# Patient Record
Sex: Male | Born: 1950
Health system: Southern US, Community
[De-identification: ages and names within clinical notes are randomized; demographics above are authoritative.]

## PROBLEM LIST (undated history)

## (undated) DIAGNOSIS — N210 Calculus in bladder: Secondary | ICD-10-CM

## (undated) DIAGNOSIS — I8390 Asymptomatic varicose veins of unspecified lower extremity: Secondary | ICD-10-CM

## (undated) DIAGNOSIS — N400 Enlarged prostate without lower urinary tract symptoms: Secondary | ICD-10-CM

## (undated) DIAGNOSIS — Q446 Cystic disease of liver: Secondary | ICD-10-CM

## (undated) DIAGNOSIS — E785 Hyperlipidemia, unspecified: Secondary | ICD-10-CM

## (undated) DIAGNOSIS — R002 Palpitations: Secondary | ICD-10-CM

## (undated) DIAGNOSIS — I4819 Other persistent atrial fibrillation: Secondary | ICD-10-CM

## (undated) DIAGNOSIS — I878 Other specified disorders of veins: Secondary | ICD-10-CM

## (undated) DIAGNOSIS — Z9289 Personal history of other medical treatment: Secondary | ICD-10-CM

## (undated) DIAGNOSIS — M51369 Other intervertebral disc degeneration, lumbar region without mention of lumbar back pain or lower extremity pain: Secondary | ICD-10-CM

## (undated) DIAGNOSIS — I4891 Unspecified atrial fibrillation: Secondary | ICD-10-CM

## (undated) DIAGNOSIS — I10 Essential (primary) hypertension: Secondary | ICD-10-CM

## (undated) DIAGNOSIS — M5136 Other intervertebral disc degeneration, lumbar region: Secondary | ICD-10-CM

## (undated) HISTORY — DX: Unspecified atrial fibrillation: I48.91

## (undated) HISTORY — PX: LASER ABLATION: SHX1947

## (undated) HISTORY — DX: Other persistent atrial fibrillation: I48.19

## (undated) HISTORY — DX: Essential (primary) hypertension: I10

## (undated) HISTORY — PX: FINGER FRACTURE SURGERY: SHX638

## (undated) HISTORY — PX: PROSTATE SURGERY: SHX751

## (undated) HISTORY — PX: TONSILLECTOMY: SUR1361

## (undated) HISTORY — PX: HERNIA REPAIR: SHX51

## (undated) HISTORY — PX: COLON SURGERY: SHX602

## (undated) HISTORY — DX: Hyperlipidemia, unspecified: E78.5

## (undated) SURGERY — COLONOSCOPY WITH PROPOFOL
Anesthesia: General

---

## 2008-07-18 ENCOUNTER — Ambulatory Visit: Payer: Self-pay | Admitting: Orthopedic Surgery

## 2008-07-18 ENCOUNTER — Other Ambulatory Visit: Payer: Self-pay

## 2008-07-22 ENCOUNTER — Ambulatory Visit: Payer: Self-pay | Admitting: Orthopedic Surgery

## 2008-09-23 ENCOUNTER — Encounter: Payer: Self-pay | Admitting: Orthopedic Surgery

## 2008-10-12 ENCOUNTER — Encounter: Payer: Self-pay | Admitting: Orthopedic Surgery

## 2013-08-27 ENCOUNTER — Ambulatory Visit: Payer: Self-pay | Admitting: Orthopedic Surgery

## 2015-06-03 ENCOUNTER — Other Ambulatory Visit: Payer: Self-pay | Admitting: Cardiology

## 2015-06-03 DIAGNOSIS — R1011 Right upper quadrant pain: Secondary | ICD-10-CM

## 2015-06-09 ENCOUNTER — Other Ambulatory Visit: Payer: Self-pay | Admitting: Internal Medicine

## 2015-06-10 ENCOUNTER — Ambulatory Visit (HOSPITAL_BASED_OUTPATIENT_CLINIC_OR_DEPARTMENT_OTHER): Payer: Self-pay

## 2015-06-10 ENCOUNTER — Ambulatory Visit
Admission: RE | Admit: 2015-06-10 | Discharge: 2015-06-10 | Disposition: A | Discharge status: Home / Self Care | Payer: 59 | Source: Ambulatory Visit | Attending: Cardiology | Admitting: Cardiology

## 2015-06-10 DIAGNOSIS — R1011 Right upper quadrant pain: Secondary | ICD-10-CM

## 2015-06-10 DIAGNOSIS — K7689 Other specified diseases of liver: Secondary | ICD-10-CM | POA: Diagnosis not present

## 2016-04-13 DIAGNOSIS — H60509 Unspecified acute noninfective otitis externa, unspecified ear: Secondary | ICD-10-CM | POA: Diagnosis not present

## 2016-04-20 DIAGNOSIS — H6042 Cholesteatoma of left external ear: Secondary | ICD-10-CM | POA: Diagnosis not present

## 2016-04-20 DIAGNOSIS — H9192 Unspecified hearing loss, left ear: Secondary | ICD-10-CM | POA: Diagnosis not present

## 2016-04-20 DIAGNOSIS — M5440 Lumbago with sciatica, unspecified side: Secondary | ICD-10-CM | POA: Diagnosis not present

## 2016-04-20 DIAGNOSIS — H60509 Unspecified acute noninfective otitis externa, unspecified ear: Secondary | ICD-10-CM | POA: Diagnosis not present

## 2016-05-05 DIAGNOSIS — H60332 Swimmer's ear, left ear: Secondary | ICD-10-CM | POA: Diagnosis not present

## 2016-05-05 DIAGNOSIS — H6122 Impacted cerumen, left ear: Secondary | ICD-10-CM | POA: Diagnosis not present

## 2016-05-19 DIAGNOSIS — H60333 Swimmer's ear, bilateral: Secondary | ICD-10-CM | POA: Diagnosis not present

## 2016-05-23 DIAGNOSIS — Z125 Encounter for screening for malignant neoplasm of prostate: Secondary | ICD-10-CM | POA: Diagnosis not present

## 2016-05-23 DIAGNOSIS — R5381 Other malaise: Secondary | ICD-10-CM | POA: Diagnosis not present

## 2016-05-23 DIAGNOSIS — E784 Other hyperlipidemia: Secondary | ICD-10-CM | POA: Diagnosis not present

## 2016-05-23 DIAGNOSIS — I10 Essential (primary) hypertension: Secondary | ICD-10-CM | POA: Diagnosis not present

## 2016-05-30 DIAGNOSIS — Z0000 Encounter for general adult medical examination without abnormal findings: Secondary | ICD-10-CM | POA: Diagnosis not present

## 2016-09-12 DIAGNOSIS — Z23 Encounter for immunization: Secondary | ICD-10-CM | POA: Diagnosis not present

## 2016-09-12 DIAGNOSIS — C801 Malignant (primary) neoplasm, unspecified: Secondary | ICD-10-CM | POA: Diagnosis not present

## 2016-09-12 DIAGNOSIS — H60509 Unspecified acute noninfective otitis externa, unspecified ear: Secondary | ICD-10-CM | POA: Diagnosis not present

## 2016-09-12 DIAGNOSIS — D5701 Hb-SS disease with acute chest syndrome: Secondary | ICD-10-CM | POA: Diagnosis not present

## 2016-09-12 DIAGNOSIS — H9212 Otorrhea, left ear: Secondary | ICD-10-CM | POA: Diagnosis not present

## 2017-06-27 ENCOUNTER — Ambulatory Visit
Admission: RE | Admit: 2017-06-27 | Discharge: 2017-06-27 | Disposition: A | Discharge status: Home / Self Care | Payer: PPO | Source: Ambulatory Visit | Attending: Internal Medicine | Admitting: Internal Medicine

## 2017-06-27 ENCOUNTER — Other Ambulatory Visit: Payer: Self-pay | Admitting: Internal Medicine

## 2017-06-27 DIAGNOSIS — M25512 Pain in left shoulder: Secondary | ICD-10-CM | POA: Diagnosis not present

## 2017-06-27 DIAGNOSIS — M19012 Primary osteoarthritis, left shoulder: Secondary | ICD-10-CM | POA: Diagnosis not present

## 2017-06-27 DIAGNOSIS — M47812 Spondylosis without myelopathy or radiculopathy, cervical region: Secondary | ICD-10-CM | POA: Insufficient documentation

## 2017-07-24 DIAGNOSIS — M19012 Primary osteoarthritis, left shoulder: Secondary | ICD-10-CM | POA: Diagnosis not present

## 2017-07-24 DIAGNOSIS — M75102 Unspecified rotator cuff tear or rupture of left shoulder, not specified as traumatic: Secondary | ICD-10-CM | POA: Diagnosis not present

## 2017-07-24 DIAGNOSIS — M47812 Spondylosis without myelopathy or radiculopathy, cervical region: Secondary | ICD-10-CM | POA: Diagnosis not present

## 2017-07-24 DIAGNOSIS — M4692 Unspecified inflammatory spondylopathy, cervical region: Secondary | ICD-10-CM | POA: Diagnosis not present

## 2017-08-03 DIAGNOSIS — E784 Other hyperlipidemia: Secondary | ICD-10-CM | POA: Diagnosis not present

## 2017-08-03 DIAGNOSIS — R5381 Other malaise: Secondary | ICD-10-CM | POA: Diagnosis not present

## 2017-08-03 DIAGNOSIS — I10 Essential (primary) hypertension: Secondary | ICD-10-CM | POA: Diagnosis not present

## 2017-08-03 DIAGNOSIS — Z125 Encounter for screening for malignant neoplasm of prostate: Secondary | ICD-10-CM | POA: Diagnosis not present

## 2017-09-06 ENCOUNTER — Ambulatory Visit: Payer: Self-pay

## 2017-09-06 DIAGNOSIS — H2513 Age-related nuclear cataract, bilateral: Secondary | ICD-10-CM | POA: Diagnosis not present

## 2017-09-07 ENCOUNTER — Ambulatory Visit: Payer: Self-pay

## 2017-09-29 ENCOUNTER — Encounter (INDEPENDENT_AMBULATORY_CARE_PROVIDER_SITE_OTHER): Payer: Self-pay | Admitting: Vascular Surgery

## 2017-09-29 ENCOUNTER — Ambulatory Visit (INDEPENDENT_AMBULATORY_CARE_PROVIDER_SITE_OTHER): Payer: PPO | Admitting: Vascular Surgery

## 2017-09-29 VITALS — BP 127/79 | HR 67 | Resp 16 | Ht 75.0 in | Wt 258.0 lb

## 2017-09-29 DIAGNOSIS — I10 Essential (primary) hypertension: Secondary | ICD-10-CM

## 2017-09-29 DIAGNOSIS — I83813 Varicose veins of bilateral lower extremities with pain: Secondary | ICD-10-CM

## 2017-09-29 DIAGNOSIS — R600 Localized edema: Secondary | ICD-10-CM | POA: Diagnosis not present

## 2017-09-29 NOTE — Progress Notes (Signed)
Subjective:    Patient ID: Luis Patton, male    DOB: [birth year], 66 y.o.   MRN: [id number] Chief Complaint  Patient presents with  . New Patient (Initial Visit)    Varicose Veins   Presents as a new patient with a chief complaint of "painful varicose veins". Patient endorses a long-standing history of varicose veins with discomfort to his lower extremities. Recently, the patient has noticed a discoloration forming on his calves. This is what prompted him to seek medical attention. The discomfort to the patient's varicose veins worsens towards the end of the day. The patient also experiences "mild" bilateral lower extremity swelling. The swelling is associated with a tingling sensation to his feet. Patient denies any surgery or trauma to the lower extremity. Patient denies any DVT history. At this time, the patient is not engaging in conservative therapy.Patient denies any claudication-like symptoms rest pain or ulcerations lower extremity. The patient's discomfort has progressed to the point he is unable to function on a daily basis. Patient denies any fever, nausea or vomiting.   Review of Systems  Constitutional: Negative.   HENT: Negative.   Eyes: Negative.   Respiratory: Negative.   Cardiovascular: Positive for leg swelling.       Painful varicose veins  Gastrointestinal: Negative.   Endocrine: Negative.   Genitourinary: Negative.   Musculoskeletal: Negative.   Skin: Positive for color change.  Allergic/Immunologic: Negative.   Neurological: Negative.   Hematological: Negative.   Psychiatric/Behavioral: Negative.       Objective:   Physical Exam  Constitutional: He is oriented to person, place, and time. He appears well-developed and well-nourished. No distress.  HENT:  Head: Normocephalic and atraumatic.  Eyes: Pupils are equal, round, and reactive to light. Conjunctivae are normal.  Neck: Normal range of motion.  Cardiovascular: Normal rate, regular rhythm, normal heart  sounds and intact distal pulses.   Pulses:      Radial pulses are 2+ on the right side, and 2+ on the left side.       Dorsalis pedis pulses are 2+ on the right side, and 2+ on the left side.       Posterior tibial pulses are 2+ on the right side, and 2+ on the left side.  Pulmonary/Chest: Effort normal.  Musculoskeletal: Normal range of motion. He exhibits edema (mild bilateral lower extremity edema).  Neurological: He is alert and oriented to person, place, and time.  Skin: He is not diaphoretic.  >1cm varicose veins scattered to the bilateral lower extremity. There is mild stasis dermatitis noted bilaterally. There are no skin changes. There is no cellulitis.  Psychiatric: He has a normal mood and affect. His behavior is normal. Judgment and thought content normal.  Vitals reviewed.  BP 127/79 (BP Location: Right Arm)   Pulse 67   Resp 16   Ht 6\' 3"  (1.905 m)   Wt 258 lb (117 kg)   BMI 32.25 kg/m   Past Medical History:  Diagnosis Date  . Hyperlipidemia   . Hypertension    Social History   Social History  . Marital status: Married    Spouse name: N/A  . Number of children: N/A  . Years of education: N/A   Occupational History  . Not on file.   Social History Main Topics  . Smoking status: Former Research scientist (life sciences)  . Smokeless tobacco: Never Used  . Alcohol use No  . Drug use: Unknown  . Sexual activity: Not on file   Other Topics Concern  .  Not on file   Social History Narrative  . No narrative on file   No past surgical history on file.  No family history on file.  No Known Allergies     Assessment & Plan:  Presents as a new patient with a chief complaint of "painful varicose veins". Patient endorses a long-standing history of varicose veins with discomfort to his lower extremities. Recently, the patient has noticed a discoloration forming on his calves. This is what prompted him to seek medical attention. The discomfort to the patient's varicose veins worsens  towards the end of the day. The patient also experiences "mild" bilateral lower extremity swelling. The swelling is associated with a tingling sensation to his feet. Patient denies any surgery or trauma to the lower extremity. Patient denies any DVT history. At this time, the patient is not engaging in conservative therapy.Patient denies any claudication-like symptoms rest pain or ulcerations lower extremity. The patient's discomfort has progressed to the point he is unable to function on a daily basis. Patient denies any fever, nausea or vomiting.  1. Varicose veins of both lower extremities with pain - New manage them.  The patient was encouraged to wear graduated compression stockings (20-30 mmHg) on a daily basis. The patient was instructed to begin wearing the stockings first thing in the morning and removing them in the evening. The patient was instructed specifically not to sleep in the stockings. Prescription given. In addition, behavioral modification including elevation during the day will be initiated. Anti-inflammatories for pain. The patient will follow up in three months to asses conservative management.  Information on chronic venous insufficiency and compression stockings was given to the patient. The patient was instructed to call the office in the interim if any worsening edema or ulcerations to the legs, feet or toes occurs. The patient expresses their understanding.  - VAS [place] LOWER EXTREMITY VENOUS REFLUX; Future  2. Bilateral lower extremity edema - New As above  3. Essential hypertension - Stable As above  No current outpatient prescriptions on file prior to visit.   No current facility-administered medications on file prior to visit.    There are no Patient Instructions on file for this visit. No Follow-up on file.  Ady Heimann A Taje Tondreau, PA-C

## 2017-10-05 ENCOUNTER — Ambulatory Visit (INDEPENDENT_AMBULATORY_CARE_PROVIDER_SITE_OTHER): Payer: PPO | Admitting: Urology

## 2017-10-05 ENCOUNTER — Encounter: Payer: Self-pay | Admitting: Urology

## 2017-10-05 VITALS — BP 124/76 | HR 82 | Ht 75.0 in | Wt 254.6 lb

## 2017-10-05 DIAGNOSIS — R310 Gross hematuria: Secondary | ICD-10-CM | POA: Diagnosis not present

## 2017-10-05 DIAGNOSIS — R3129 Other microscopic hematuria: Secondary | ICD-10-CM

## 2017-10-05 LAB — BLADDER SCAN AMB NON-IMAGING: SCAN RESULT: 117

## 2017-10-05 MED ORDER — ALFUZOSIN HCL ER 10 MG PO TB24: 10.0000 mg | ORAL_TABLET | Freq: Every day | ORAL | 11 refills | Status: DC

## 2017-10-05 NOTE — Progress Notes (Signed)
10/05/2017 3:35 PM   Luis Patton [birth year] [id number]  Referring provider: Cletis Athens, MD [address] [place], [place] [address]  Chief Complaint  Patient presents with  . Hematuria    HPI: The patient is a 66 year old gentleman presents today for evaluation for gross hematuria.  1.  Gross hematuria The patient presents after developing gross hematuria.  He had approximately 3 episodes of gross painless hematuria.  He has persistent microscopic hematuria today.  No history of nephrolithiasis.  He was treated earlier this year for a UTI.  No current dysuria.  Microscopic hematuria presents today.  2. BPH Patient with complaints of nocturia x3-4, weak stream, hesitancy, intermittency, and feeling of incomplete bladder emptying.  His PVR today is 117.  He has been on Flomax for this in the past which completely resolve his symptoms.  He had minor dizziness so his PCP told him to stop this.  He was on finasteride then for 1 month which he did not notice symptom improvement but did not like the way it made him feel.  3.  Prostate cancer screening PSA was 1.6 in August 2018.   PMH: Past Medical History:  Diagnosis Date  . Hyperlipidemia   . Hypertension     Surgical History: Past Surgical History:  Procedure Laterality Date  . HERNIA REPAIR      Home Medications:  Allergies as of 10/05/2017   No Known Allergies     Medication List       Accurate as of 10/05/17  3:35 PM. Always use your most recent med list.          alfuzosin 10 MG 24 hr tablet Commonly known as:  UROXATRAL Take 1 tablet (10 mg total) by mouth daily with breakfast.   amLODipine 5 MG tablet Commonly known as:  NORVASC Take by mouth.   aspirin EC 81 MG tablet Take 81 mg by mouth daily.   benazepril 40 MG tablet Commonly known as:  LOTENSIN Take by mouth.   gabapentin 300 MG capsule Commonly known as:  NEURONTIN Take by mouth.   hydrochlorothiazide 12.5 MG capsule Commonly known  as:  MICROZIDE Take by mouth.   multivitamin tablet Take 1 tablet by mouth daily.   triamcinolone 55 MCG/ACT Aero nasal inhaler Commonly known as:  NASACORT Place into the nose.       Allergies: No Known Allergies  Family History: Family History  Problem Relation Age of Onset  . Prostate cancer Neg Hx   . Bladder Cancer Neg Hx   . Kidney cancer Neg Hx     Social History:  reports that he has quit smoking. He has never used smokeless tobacco. He reports that he does not drink alcohol. His drug history is not on file.  ROS: UROLOGY Frequent Urination?: Yes Hard to postpone urination?: Yes Burning/pain with urination?: Yes Get up at night to urinate?: Yes Leakage of urine?: Yes Urine stream starts and stops?: Yes Trouble starting stream?: Yes Do you have to strain to urinate?: Yes Blood in urine?: Yes Urinary tract infection?: Yes Sexually transmitted disease?: No Injury to kidneys or bladder?: No Painful intercourse?: No Weak stream?: Yes Erection problems?: Yes Penile pain?: No  Gastrointestinal Nausea?: No Vomiting?: No Indigestion/heartburn?: Yes Diarrhea?: No Constipation?: No  Constitutional Fever: No Night sweats?: No Weight loss?: No Fatigue?: Yes  Skin Skin rash/lesions?: No Itching?: No  Eyes Blurred vision?: Yes Double vision?: No  Ears/Nose/Throat Sore throat?: No Sinus problems?: Yes  Hematologic/Lymphatic Swollen glands?: No  Easy bruising?: Yes  Cardiovascular Leg swelling?: Yes Chest pain?: No  Respiratory Cough?: No Shortness of breath?: No  Endocrine Excessive thirst?: No  Musculoskeletal Back pain?: Yes Joint pain?: Yes  Neurological Headaches?: Yes Dizziness?: Yes  Psychologic Depression?: No Anxiety?: No  Physical Exam: BP 124/76 (BP Location: Right Arm, Patient Position: Sitting, Cuff Size: Normal)   Pulse 82   Ht 6\' 3"  (1.905 m)   Wt 254 lb 9.6 oz (115.5 kg)   BMI 31.82 kg/m   Constitutional:   Alert and oriented, No acute distress. HEENT: [place] AT, moist mucus membranes.  Trachea midline, no masses. Cardiovascular: No clubbing, cyanosis, or edema. Respiratory: Normal respiratory effort, no increased work of breathing. GI: Abdomen is soft, nontender, nondistended, no abdominal masses GU: No CVA tenderness.  Normal phallus.  Testicles descended bilaterally.  Benign.  DRE 30 g benign Skin: No rashes, bruises or suspicious lesions. Lymph: No cervical or inguinal adenopathy. Neurologic: Grossly intact, no focal deficits, moving all 4 extremities. Psychiatric: Normal mood and affect.  Laboratory Data: No results found for: WBC, HGB, HCT, MCV, PLT  No results found for: CREATININE  No results found for: PSA  No results found for: TESTOSTERONE  No results found for: HGBA1C  Urinalysis No results found for: COLORURINE, APPEARANCEUR, LABSPEC, PHURINE, GLUCOSEU, HGBUR, BILIRUBINUR, KETONESUR, PROTEINUR, UROBILINOGEN, NITRITE, LEUKOCYTESUR  Assessment & Plan:    1.  Gross hematuria We will arrange for CT hematuria protocol followed by office cystoscopy  2.  BPH Will start the patient on alfuzosin.  Patient warned to stop this medication if he experiences dizziness.  3.  Prostate cancer screening Up-to-date   Return for after CT for cysto.  Nickie Retort, MD  [facility] [address] [place], [place] [address] [phone]

## 2017-10-05 NOTE — H&P (View-Only) (Signed)
10/05/2017 3:35 PM   Luis Patton [birth year] [id number]  Referring provider: Cletis Athens, MD [address] [place], [place] [address]  Chief Complaint  Patient presents with  . Hematuria    HPI: The patient is a 66 year old gentleman presents today for evaluation for gross hematuria.  1.  Gross hematuria The patient presents after developing gross hematuria.  He had approximately 3 episodes of gross painless hematuria.  He has persistent microscopic hematuria today.  No history of nephrolithiasis.  He was treated earlier this year for a UTI.  No current dysuria.  Microscopic hematuria presents today.  2. BPH Patient with complaints of nocturia x3-4, weak stream, hesitancy, intermittency, and feeling of incomplete bladder emptying.  His PVR today is 117.  He has been on Flomax for this in the past which completely resolve his symptoms.  He had minor dizziness so his PCP told him to stop this.  He was on finasteride then for 1 month which he did not notice symptom improvement but did not like the way it made him feel.  3.  Prostate cancer screening PSA was 1.6 in August 2018.   PMH: Past Medical History:  Diagnosis Date  . Hyperlipidemia   . Hypertension     Surgical History: Past Surgical History:  Procedure Laterality Date  . HERNIA REPAIR      Home Medications:  Allergies as of 10/05/2017   No Known Allergies     Medication List       Accurate as of 10/05/17  3:35 PM. Always use your most recent med list.          alfuzosin 10 MG 24 hr tablet Commonly known as:  UROXATRAL Take 1 tablet (10 mg total) by mouth daily with breakfast.   amLODipine 5 MG tablet Commonly known as:  NORVASC Take by mouth.   aspirin EC 81 MG tablet Take 81 mg by mouth daily.   benazepril 40 MG tablet Commonly known as:  LOTENSIN Take by mouth.   gabapentin 300 MG capsule Commonly known as:  NEURONTIN Take by mouth.   hydrochlorothiazide 12.5 MG capsule Commonly known  as:  MICROZIDE Take by mouth.   multivitamin tablet Take 1 tablet by mouth daily.   triamcinolone 55 MCG/ACT Aero nasal inhaler Commonly known as:  NASACORT Place into the nose.       Allergies: No Known Allergies  Family History: Family History  Problem Relation Age of Onset  . Prostate cancer Neg Hx   . Bladder Cancer Neg Hx   . Kidney cancer Neg Hx     Social History:  reports that he has quit smoking. He has never used smokeless tobacco. He reports that he does not drink alcohol. His drug history is not on file.  ROS: UROLOGY Frequent Urination?: Yes Hard to postpone urination?: Yes Burning/pain with urination?: Yes Get up at night to urinate?: Yes Leakage of urine?: Yes Urine stream starts and stops?: Yes Trouble starting stream?: Yes Do you have to strain to urinate?: Yes Blood in urine?: Yes Urinary tract infection?: Yes Sexually transmitted disease?: No Injury to kidneys or bladder?: No Painful intercourse?: No Weak stream?: Yes Erection problems?: Yes Penile pain?: No  Gastrointestinal Nausea?: No Vomiting?: No Indigestion/heartburn?: Yes Diarrhea?: No Constipation?: No  Constitutional Fever: No Night sweats?: No Weight loss?: No Fatigue?: Yes  Skin Skin rash/lesions?: No Itching?: No  Eyes Blurred vision?: Yes Double vision?: No  Ears/Nose/Throat Sore throat?: No Sinus problems?: Yes  Hematologic/Lymphatic Swollen glands?: No  Easy bruising?: Yes  Cardiovascular Leg swelling?: Yes Chest pain?: No  Respiratory Cough?: No Shortness of breath?: No  Endocrine Excessive thirst?: No  Musculoskeletal Back pain?: Yes Joint pain?: Yes  Neurological Headaches?: Yes Dizziness?: Yes  Psychologic Depression?: No Anxiety?: No  Physical Exam: BP 124/76 (BP Location: Right Arm, Patient Position: Sitting, Cuff Size: Normal)   Pulse 82   Ht 6\' 3"  (1.905 m)   Wt 254 lb 9.6 oz (115.5 kg)   BMI 31.82 kg/m   Constitutional:   Alert and oriented, No acute distress. HEENT: [place] AT, moist mucus membranes.  Trachea midline, no masses. Cardiovascular: No clubbing, cyanosis, or edema. Respiratory: Normal respiratory effort, no increased work of breathing. GI: Abdomen is soft, nontender, nondistended, no abdominal masses GU: No CVA tenderness.  Normal phallus.  Testicles descended bilaterally.  Benign.  DRE 30 g benign Skin: No rashes, bruises or suspicious lesions. Lymph: No cervical or inguinal adenopathy. Neurologic: Grossly intact, no focal deficits, moving all 4 extremities. Psychiatric: Normal mood and affect.  Laboratory Data: No results found for: WBC, HGB, HCT, MCV, PLT  No results found for: CREATININE  No results found for: PSA  No results found for: TESTOSTERONE  No results found for: HGBA1C  Urinalysis No results found for: COLORURINE, APPEARANCEUR, LABSPEC, PHURINE, GLUCOSEU, HGBUR, BILIRUBINUR, KETONESUR, PROTEINUR, UROBILINOGEN, NITRITE, LEUKOCYTESUR  Assessment & Plan:    1.  Gross hematuria We will arrange for CT hematuria protocol followed by office cystoscopy  2.  BPH Will start the patient on alfuzosin.  Patient warned to stop this medication if he experiences dizziness.  3.  Prostate cancer screening Up-to-date   Return for after CT for cysto.  Nickie Retort, MD  [facility] [address] [place], [place] [address] [phone]

## 2017-10-06 LAB — MICROSCOPIC EXAMINATION
Bacteria, UA: NONE SEEN
Epithelial Cells (non renal): NONE SEEN /hpf (ref 0–10)

## 2017-10-06 LAB — URINALYSIS, COMPLETE
BILIRUBIN UA: NEGATIVE
GLUCOSE, UA: NEGATIVE
KETONES UA: NEGATIVE
Nitrite, UA: NEGATIVE
SPEC GRAV UA: 1.015 (ref 1.005–1.030)
UUROB: 0.2 mg/dL (ref 0.2–1.0)
pH, UA: 5.5 (ref 5.0–7.5)

## 2017-10-17 ENCOUNTER — Ambulatory Visit
Admission: RE | Admit: 2017-10-17 | Discharge: 2017-10-17 | Disposition: A | Discharge status: Home / Self Care | Payer: PPO | Source: Ambulatory Visit | Attending: Urology | Admitting: Urology

## 2017-10-17 DIAGNOSIS — I700 Atherosclerosis of aorta: Secondary | ICD-10-CM | POA: Diagnosis not present

## 2017-10-17 DIAGNOSIS — K5730 Diverticulosis of large intestine without perforation or abscess without bleeding: Secondary | ICD-10-CM | POA: Diagnosis not present

## 2017-10-17 DIAGNOSIS — K56699 Other intestinal obstruction unspecified as to partial versus complete obstruction: Secondary | ICD-10-CM | POA: Diagnosis not present

## 2017-10-17 DIAGNOSIS — N210 Calculus in bladder: Secondary | ICD-10-CM | POA: Insufficient documentation

## 2017-10-17 DIAGNOSIS — Q446 Cystic disease of liver: Secondary | ICD-10-CM | POA: Insufficient documentation

## 2017-10-17 DIAGNOSIS — K7689 Other specified diseases of liver: Secondary | ICD-10-CM | POA: Diagnosis not present

## 2017-10-17 DIAGNOSIS — K4090 Unilateral inguinal hernia, without obstruction or gangrene, not specified as recurrent: Secondary | ICD-10-CM | POA: Insufficient documentation

## 2017-10-17 DIAGNOSIS — M5136 Other intervertebral disc degeneration, lumbar region: Secondary | ICD-10-CM | POA: Diagnosis not present

## 2017-10-17 DIAGNOSIS — N323 Diverticulum of bladder: Secondary | ICD-10-CM | POA: Insufficient documentation

## 2017-10-17 DIAGNOSIS — M47896 Other spondylosis, lumbar region: Secondary | ICD-10-CM | POA: Insufficient documentation

## 2017-10-17 DIAGNOSIS — R310 Gross hematuria: Secondary | ICD-10-CM

## 2017-10-17 LAB — POCT I-STAT CREATININE: CREATININE: 1.3 mg/dL — AB (ref 0.61–1.24)

## 2017-10-17 MED ORDER — IOPAMIDOL (ISOVUE-300) INJECTION 61%
125.0000 mL | Freq: Once | INTRAVENOUS | Status: AC | PRN
  Administered 2017-10-17: 125 mL via INTRAVENOUS

## 2017-10-19 ENCOUNTER — Ambulatory Visit: Payer: PPO | Admitting: Urology

## 2017-10-19 ENCOUNTER — Encounter: Payer: Self-pay | Admitting: Urology

## 2017-10-19 DIAGNOSIS — R310 Gross hematuria: Secondary | ICD-10-CM | POA: Diagnosis not present

## 2017-10-19 DIAGNOSIS — N210 Calculus in bladder: Secondary | ICD-10-CM | POA: Diagnosis not present

## 2017-10-19 DIAGNOSIS — K639 Disease of intestine, unspecified: Secondary | ICD-10-CM

## 2017-10-19 LAB — MICROSCOPIC EXAMINATION: RBC, UA: >30 /hpf — ABNORMAL HIGH (ref 0–?)

## 2017-10-19 LAB — URINALYSIS, COMPLETE
Bilirubin, UA: NEGATIVE
Glucose, UA: NEGATIVE
Ketones, UA: NEGATIVE
Leukocytes, UA: NEGATIVE
NITRITE UA: NEGATIVE
PH UA: 5.5 (ref 5.0–7.5)
Specific Gravity, UA: 1.015 (ref 1.005–1.030)
Urobilinogen, Ur: 0.2 mg/dL (ref 0.2–1.0)

## 2017-10-19 MED ORDER — LIDOCAINE HCL 2 % EX GEL
1.0000 "application " | Freq: Once | CUTANEOUS | Status: AC
  Administered 2017-10-19: 1 via URETHRAL

## 2017-10-19 MED ORDER — CIPROFLOXACIN HCL 500 MG PO TABS
500.0000 mg | ORAL_TABLET | Freq: Once | ORAL | Status: AC
  Administered 2017-10-19: 500 mg via ORAL

## 2017-10-19 NOTE — Progress Notes (Signed)
10/19/17  CC: No chief complaint on file.   HPI:  The patient is a 66 year old gentleman presents today for evaluation for gross hematuria.  1.  Gross hematuria The patient presents after developing gross hematuria.  He had approximately 3 episodes of gross painless hematuria.  He has persistent microscopic hematuria today.  No history of nephrolithiasis.  He was treated earlier this year for a UTI.  No current dysuria.  Microscopic hematuria presents today.  CT urogram showed a two-point centimeter calculus in the urinary bladder.  There are several bladder diverticula and mild bladder wall thickening.  No other source of hematuria.  There was an incidental finding of a 2.6 cm in length narrowing of the rectosigmoid colon.  Further evaluation via endoscopy was recommended if patient has not had recent colonoscopy.  2. BPH Patient with complaints of nocturia x3-4, weak stream, hesitancy, intermittency, and feeling of incomplete bladder emptying.  His PVR today is 117.  He has been on Flomax for this in the past which completely resolve his symptoms.  He had minor dizziness so his PCP told him to stop this.  He was on finasteride then for 1 month which he did not notice symptom improvement but did not like the way it made him feel. He was started on alfuzosin at his last appointment.  3.  Prostate cancer screening PSA was 1.6 in August 2018.  DRE benign October 2018.    There were no vitals taken for this visit. NED. A&Ox3.   No respiratory distress   Abd soft, NT, ND Normal phallus with bilateral descended testicles  Cystoscopy Procedure Note  Patient identification was confirmed, informed consent was obtained, and patient was prepped using Betadine solution.  Lidocaine jelly was administered per urethral meatus.    Preoperative abx where received prior to procedure.     Pre-Procedure: - Inspection reveals a normal caliber ureteral meatus.  Procedure: The flexible  cystoscope was introduced without difficulty - No urethral strictures/lesions are present. - Enlarged prostate enlarged visually obstructive.  Approximately 5 cm in length. - Normal bladder neck - Bilateral ureteral orifices identified - Bladder mucosa  reveals no ulcers, tumors, or lesions - No bladder stones -Diverticula noted in posterior wall.  It is large in size with a narrow opening.  Inspection was unremarkable within the diverticulum.  Retroflexion shows no intravesical lobe.   Post-Procedure: - Patient tolerated the procedure well  Assessment/ Plan:  1. Bladder stone I discussed with the patient that he has a bladder stone.  We discussed cystolitholopaxy to remove the stone.  We also discussed that the standard of care is an man with a bladder stone is to perform a concurrent TURP due to enlarged prostate being the source of the bladder stone.  I think this to also help his urinary symptoms which have not been well controlled with his medication.  We discussed the risks, benefits, indications of this procedure.  He understands the risks include but are not limited to bleeding, infection, need for hospitalization, need for Foley catheter, incontinence, iatrogenic injury, and incomplete symptom control.  He does understand that he will be admitted to the hospital on CBI for at least 1 night.  All questions were answered.  The patient will follow up for cystolitholapaxy with concurrent TURP.  2.  Gross hematuria We will arrange for CT hematuria protocol followed by office cystoscopy  3.  BPH Continue alfuzosin for now. TURP as above  4.  Prostate cancer screening Up-to-date  5.  Rectosigmoid narrowing -Refer to GI

## 2017-10-21 LAB — URINE CULTURE: ORGANISM ID, BACTERIA: NO GROWTH

## 2017-10-23 ENCOUNTER — Other Ambulatory Visit: Payer: Self-pay | Admitting: Radiology

## 2017-10-23 DIAGNOSIS — N210 Calculus in bladder: Secondary | ICD-10-CM

## 2017-10-23 DIAGNOSIS — N401 Enlarged prostate with lower urinary tract symptoms: Secondary | ICD-10-CM

## 2017-10-27 ENCOUNTER — Other Ambulatory Visit: Payer: Self-pay

## 2017-10-27 ENCOUNTER — Encounter
Admission: RE | Admit: 2017-10-27 | Discharge: 2017-10-27 | Disposition: A | Discharge status: Home / Self Care | Payer: PPO | Source: Ambulatory Visit | Attending: Urology | Admitting: Urology

## 2017-10-27 DIAGNOSIS — M5136 Other intervertebral disc degeneration, lumbar region: Secondary | ICD-10-CM | POA: Insufficient documentation

## 2017-10-27 DIAGNOSIS — E785 Hyperlipidemia, unspecified: Secondary | ICD-10-CM | POA: Diagnosis not present

## 2017-10-27 DIAGNOSIS — Z01810 Encounter for preprocedural cardiovascular examination: Secondary | ICD-10-CM | POA: Insufficient documentation

## 2017-10-27 DIAGNOSIS — Z01812 Encounter for preprocedural laboratory examination: Secondary | ICD-10-CM | POA: Insufficient documentation

## 2017-10-27 DIAGNOSIS — I10 Essential (primary) hypertension: Secondary | ICD-10-CM | POA: Diagnosis not present

## 2017-10-27 DIAGNOSIS — M7989 Other specified soft tissue disorders: Secondary | ICD-10-CM | POA: Insufficient documentation

## 2017-10-27 DIAGNOSIS — I83813 Varicose veins of bilateral lower extremities with pain: Secondary | ICD-10-CM | POA: Diagnosis not present

## 2017-10-27 HISTORY — DX: Other intervertebral disc degeneration, lumbar region: M51.36

## 2017-10-27 HISTORY — DX: Other intervertebral disc degeneration, lumbar region without mention of lumbar back pain or lower extremity pain: M51.369

## 2017-10-27 LAB — POTASSIUM: Potassium: 3.6 mmol/L (ref 3.5–5.1)

## 2017-10-27 NOTE — Patient Instructions (Addendum)
Your procedure is scheduled on: 11/07/17 Tues Report to Same Day Surgery 2nd floor medical mall [place] Entrance-take elevator on left to 2nd floor.  Check in with surgery information desk.) To find out your arrival time please call [phone] between 1PM - 3PM on 11/06/17 Mon  Remember: Instructions that are not followed completely may result in serious medical risk, up to and including death, or upon the discretion of your surgeon and anesthesiologist your surgery may need to be rescheduled.    _x___ 1. Do not eat food after midnight the night before your procedure. You may drink clear liquids up to 2 hours before you are scheduled to arrive at the hospital for your procedure.  Do not drink clear liquids within 2 hours of your scheduled arrival to the hospital.  Clear liquids include  --Water or Apple juice without pulp  --Clear carbohydrate beverage such as ClearFast or Gatorade  --Black Coffee or Clear Tea (No milk, no creamers, do not add anything to                  the coffee or Tea Type 1 and type 2 diabetics should only drink water.  No gum chewing or hard candies.     __x__ 2. No Alcohol for 24 hours before or after surgery.   __x__3. No Smoking for 24 prior to surgery.   ____  4. Bring all medications with you on the day of surgery if instructed.    __x__ 5. Notify your doctor if there is any change in your medical condition     (cold, fever, infections).     Do not wear jewelry, make-up, hairpins, clips or nail polish.  Do not wear lotions, powders, or perfumes. You may wear deodorant.  Do not shave 48 hours prior to surgery. Men may shave face and neck.  Do not bring valuables to the hospital.    [facility] is not responsible for any belongings or valuables.               Contacts, dentures or bridgework may not be worn into surgery.  Leave your suitcase in the car. After surgery it may be brought to your room.  For patients admitted to the hospital,  discharge time is determined by your                       treatment team.   Patients discharged the day of surgery will not be allowed to drive home.  You will need someone to drive you home and stay with you the night of your procedure.    Please read over the following fact sheets that you were given:   [facility] Preparing for Surgery and or MRSA Information   _x___ Take anti-hypertensive listed below, cardiac, seizure, asthma,     anti-reflux and psychiatric medicines. These include:  1. amLODipine (NORVASC) 5 MG   2.Prilosec the night before and the morning of surgery  3.  4.  5.  6.  ____Fleets enema or Magnesium Citrate as directed.   _x___ Use CHG Soap or sage wipes as directed on instruction sheet   ____ Use inhalers on the day of surgery and bring to hospital day of surgery  ____ Stop Metformin and Janumet 2 days prior to surgery.    ____ Take 1/2 of usual insulin dose the night before surgery and none on the morning     surgery.   _x___ Follow recommendations from Cardiologist,  Pulmonologist or PCP regarding          stopping Aspirin, Coumadin, Plavix ,Eliquis, Effient, or Pradaxa, and Pletal.  X____Stop Anti-inflammatories such as Advil, Aleve, Ibuprofen, Motrin, Naproxen, Naprosyn, Goodies powders or aspirin products. OK to take Tylenol and                          Celebrex.   _x___ Stop supplements until after surgery.  But may continue Vitamin D, Vitamin B,       and multivitamin.   ____ Bring C-Pap to the hospital.

## 2017-10-30 MED ORDER — CEFAZOLIN SODIUM-DEXTROSE 2-4 GM/100ML-% IV SOLN
2.0000 g | INTRAVENOUS | Status: AC
  Administered 2017-10-31: 2 g via INTRAVENOUS

## 2017-10-31 ENCOUNTER — Other Ambulatory Visit: Payer: Self-pay

## 2017-10-31 ENCOUNTER — Ambulatory Visit: Payer: PPO | Admitting: Anesthesiology

## 2017-10-31 ENCOUNTER — Observation Stay
Admission: RE | Admit: 2017-10-31 | Discharge: 2017-11-01 | Disposition: A | Discharge status: Home / Self Care | Payer: PPO | Source: Ambulatory Visit | Attending: Urology | Admitting: Urology

## 2017-10-31 ENCOUNTER — Encounter: Payer: Self-pay | Admitting: *Deleted

## 2017-10-31 ENCOUNTER — Encounter
Admission: RE | Disposition: A | Discharge status: Home / Self Care | Payer: Self-pay | Source: Ambulatory Visit | Attending: Urology

## 2017-10-31 DIAGNOSIS — N411 Chronic prostatitis: Secondary | ICD-10-CM | POA: Diagnosis not present

## 2017-10-31 DIAGNOSIS — N401 Enlarged prostate with lower urinary tract symptoms: Principal | ICD-10-CM | POA: Insufficient documentation

## 2017-10-31 DIAGNOSIS — E669 Obesity, unspecified: Secondary | ICD-10-CM | POA: Diagnosis not present

## 2017-10-31 DIAGNOSIS — Z7982 Long term (current) use of aspirin: Secondary | ICD-10-CM | POA: Diagnosis not present

## 2017-10-31 DIAGNOSIS — I10 Essential (primary) hypertension: Secondary | ICD-10-CM | POA: Insufficient documentation

## 2017-10-31 DIAGNOSIS — N3289 Other specified disorders of bladder: Secondary | ICD-10-CM | POA: Insufficient documentation

## 2017-10-31 DIAGNOSIS — N320 Bladder-neck obstruction: Secondary | ICD-10-CM | POA: Insufficient documentation

## 2017-10-31 DIAGNOSIS — N138 Other obstructive and reflux uropathy: Secondary | ICD-10-CM | POA: Insufficient documentation

## 2017-10-31 DIAGNOSIS — Z6833 Body mass index (BMI) 33.0-33.9, adult: Secondary | ICD-10-CM | POA: Diagnosis not present

## 2017-10-31 DIAGNOSIS — N323 Diverticulum of bladder: Secondary | ICD-10-CM | POA: Insufficient documentation

## 2017-10-31 DIAGNOSIS — E785 Hyperlipidemia, unspecified: Secondary | ICD-10-CM | POA: Insufficient documentation

## 2017-10-31 DIAGNOSIS — N400 Enlarged prostate without lower urinary tract symptoms: Secondary | ICD-10-CM | POA: Diagnosis not present

## 2017-10-31 DIAGNOSIS — Z87891 Personal history of nicotine dependence: Secondary | ICD-10-CM | POA: Diagnosis not present

## 2017-10-31 DIAGNOSIS — M1990 Unspecified osteoarthritis, unspecified site: Secondary | ICD-10-CM | POA: Diagnosis not present

## 2017-10-31 DIAGNOSIS — Z9079 Acquired absence of other genital organ(s): Secondary | ICD-10-CM

## 2017-10-31 DIAGNOSIS — M5136 Other intervertebral disc degeneration, lumbar region: Secondary | ICD-10-CM | POA: Insufficient documentation

## 2017-10-31 DIAGNOSIS — N210 Calculus in bladder: Secondary | ICD-10-CM

## 2017-10-31 DIAGNOSIS — Z79899 Other long term (current) drug therapy: Secondary | ICD-10-CM | POA: Insufficient documentation

## 2017-10-31 HISTORY — PX: TRANSURETHRAL RESECTION OF PROSTATE: SHX73

## 2017-10-31 HISTORY — PX: CYSTOSCOPY WITH LITHOLAPAXY: SHX1425

## 2017-10-31 SURGERY — TURP (TRANSURETHRAL RESECTION OF PROSTATE)
Anesthesia: General | Site: Prostate | Wound class: Clean Contaminated

## 2017-10-31 MED ORDER — OXYCODONE HCL 5 MG/5ML PO SOLN: 5.0000 mg | Freq: Once | ORAL | Status: DC | PRN

## 2017-10-31 MED ORDER — EPHEDRINE SULFATE 50 MG/ML IJ SOLN
INTRAMUSCULAR | Status: AC
  Filled 2017-10-31: qty 1

## 2017-10-31 MED ORDER — ROCURONIUM BROMIDE 50 MG/5ML IV SOLN
INTRAVENOUS | Status: AC
  Filled 2017-10-31: qty 1

## 2017-10-31 MED ORDER — FENTANYL CITRATE (PF) 100 MCG/2ML IJ SOLN
INTRAMUSCULAR | Status: AC
  Filled 2017-10-31: qty 2

## 2017-10-31 MED ORDER — PROPOFOL 10 MG/ML IV BOLUS
INTRAVENOUS | Status: DC | PRN
  Administered 2017-10-31: 40 mg via INTRAVENOUS
  Administered 2017-10-31: 190 mg via INTRAVENOUS
  Administered 2017-10-31: 50 mg via INTRAVENOUS

## 2017-10-31 MED ORDER — DEXAMETHASONE SODIUM PHOSPHATE 10 MG/ML IJ SOLN
INTRAMUSCULAR | Status: AC
  Filled 2017-10-31: qty 1

## 2017-10-31 MED ORDER — FENTANYL CITRATE (PF) 100 MCG/2ML IJ SOLN: 25.0000 ug | INTRAMUSCULAR | Status: DC | PRN

## 2017-10-31 MED ORDER — HYDROMORPHONE HCL 1 MG/ML IJ SOLN
INTRAMUSCULAR | Status: DC | PRN
  Administered 2017-10-31: .2 mg via INTRAVENOUS
  Administered 2017-10-31: .4 mg via INTRAVENOUS
  Administered 2017-10-31 (×2): .2 mg via INTRAVENOUS

## 2017-10-31 MED ORDER — FLUTICASONE PROPIONATE 50 MCG/ACT NA SUSP
1.0000 | Freq: Every day | NASAL | Status: DC | PRN
  Filled 2017-10-31: qty 16

## 2017-10-31 MED ORDER — MEPERIDINE HCL 50 MG/ML IJ SOLN: 6.2500 mg | INTRAMUSCULAR | Status: DC | PRN

## 2017-10-31 MED ORDER — PANTOPRAZOLE SODIUM 40 MG PO TBEC
40.0000 mg | DELAYED_RELEASE_TABLET | Freq: Every day | ORAL | Status: DC
  Administered 2017-11-01: 40 mg via ORAL
  Filled 2017-10-31: qty 1

## 2017-10-31 MED ORDER — ACETAMINOPHEN 10 MG/ML IV SOLN
INTRAVENOUS | Status: AC
  Filled 2017-10-31: qty 100

## 2017-10-31 MED ORDER — LIDOCAINE HCL (PF) 2 % IJ SOLN
INTRAMUSCULAR | Status: AC
  Filled 2017-10-31: qty 10

## 2017-10-31 MED ORDER — LACTATED RINGERS IV SOLN
INTRAVENOUS | Status: DC
  Administered 2017-10-31: 75 mL/h via INTRAVENOUS
  Administered 2017-11-01: 02:00:00 via INTRAVENOUS

## 2017-10-31 MED ORDER — ONDANSETRON HCL 4 MG/2ML IJ SOLN
INTRAMUSCULAR | Status: DC | PRN
  Administered 2017-10-31: 4 mg via INTRAVENOUS

## 2017-10-31 MED ORDER — PROPOFOL 10 MG/ML IV BOLUS
INTRAVENOUS | Status: AC
  Filled 2017-10-31: qty 20

## 2017-10-31 MED ORDER — BELLADONNA ALKALOIDS-OPIUM 16.2-60 MG RE SUPP: 1.0000 | Freq: Three times a day (TID) | RECTAL | Status: DC | PRN

## 2017-10-31 MED ORDER — DEXAMETHASONE SODIUM PHOSPHATE 10 MG/ML IJ SOLN
INTRAMUSCULAR | Status: DC | PRN
  Administered 2017-10-31: 8 mg via INTRAVENOUS

## 2017-10-31 MED ORDER — PHENYLEPHRINE HCL 10 MG/ML IJ SOLN
INTRAMUSCULAR | Status: AC
  Filled 2017-10-31: qty 1

## 2017-10-31 MED ORDER — PHENYLEPHRINE HCL 10 MG/ML IJ SOLN
INTRAMUSCULAR | Status: DC | PRN
  Administered 2017-10-31 (×8): 100 ug via INTRAVENOUS

## 2017-10-31 MED ORDER — AMLODIPINE BESYLATE 5 MG PO TABS
5.0000 mg | ORAL_TABLET | Freq: Every day | ORAL | Status: DC
  Administered 2017-11-01: 5 mg via ORAL
  Filled 2017-10-31: qty 1

## 2017-10-31 MED ORDER — EPHEDRINE SULFATE 50 MG/ML IJ SOLN
INTRAMUSCULAR | Status: DC | PRN
  Administered 2017-10-31: 5 mg via INTRAVENOUS
  Administered 2017-10-31 (×2): 10 mg via INTRAVENOUS
  Administered 2017-10-31: 5 mg via INTRAVENOUS

## 2017-10-31 MED ORDER — OXYCODONE HCL 5 MG PO TABS: 5.0000 mg | ORAL_TABLET | Freq: Once | ORAL | Status: DC | PRN

## 2017-10-31 MED ORDER — OXYCODONE-ACETAMINOPHEN 5-325 MG PO TABS: 1.0000 | ORAL_TABLET | ORAL | Status: DC | PRN

## 2017-10-31 MED ORDER — SUCCINYLCHOLINE CHLORIDE 20 MG/ML IJ SOLN
INTRAMUSCULAR | Status: DC | PRN
  Administered 2017-10-31: 140 mg via INTRAVENOUS

## 2017-10-31 MED ORDER — ACETAMINOPHEN 10 MG/ML IV SOLN
INTRAVENOUS | Status: DC | PRN
Start: 2017-10-31 — End: 2017-10-31
  Administered 2017-10-31: 1000 mg via INTRAVENOUS

## 2017-10-31 MED ORDER — ONDANSETRON HCL 4 MG/2ML IJ SOLN: 4.0000 mg | Freq: Three times a day (TID) | INTRAMUSCULAR | Status: DC | PRN

## 2017-10-31 MED ORDER — ONDANSETRON HCL 4 MG/2ML IJ SOLN
INTRAMUSCULAR | Status: AC
  Filled 2017-10-31: qty 2

## 2017-10-31 MED ORDER — CEFAZOLIN SODIUM-DEXTROSE 2-4 GM/100ML-% IV SOLN
INTRAVENOUS | Status: AC
  Filled 2017-10-31: qty 100

## 2017-10-31 MED ORDER — FENTANYL CITRATE (PF) 100 MCG/2ML IJ SOLN
INTRAMUSCULAR | Status: DC | PRN
  Administered 2017-10-31 (×6): 50 ug via INTRAVENOUS

## 2017-10-31 MED ORDER — HYDROCHLOROTHIAZIDE 12.5 MG PO CAPS
12.5000 mg | ORAL_CAPSULE | Freq: Every day | ORAL | Status: DC
  Administered 2017-10-31 – 2017-11-01 (×2): 12.5 mg via ORAL
  Filled 2017-10-31 (×2): qty 1

## 2017-10-31 MED ORDER — LACTATED RINGERS IV SOLN
INTRAVENOUS | Status: DC
  Administered 2017-10-31 (×3): via INTRAVENOUS

## 2017-10-31 MED ORDER — ADULT MULTIVITAMIN W/MINERALS CH
1.0000 | ORAL_TABLET | Freq: Every day | ORAL | Status: DC
Start: 2017-11-01 — End: 2017-11-01
  Administered 2017-11-01: 1 via ORAL
  Filled 2017-10-31: qty 1

## 2017-10-31 MED ORDER — HYDROMORPHONE HCL 1 MG/ML IJ SOLN
INTRAMUSCULAR | Status: AC
Start: 2017-10-31 — End: ?
  Filled 2017-10-31: qty 1

## 2017-10-31 MED ORDER — BENAZEPRIL HCL 20 MG PO TABS
40.0000 mg | ORAL_TABLET | Freq: Every day | ORAL | Status: DC
  Administered 2017-10-31 – 2017-11-01 (×2): 40 mg via ORAL
  Filled 2017-10-31: qty 1
  Filled 2017-10-31: qty 2

## 2017-10-31 MED ORDER — PROMETHAZINE HCL 25 MG/ML IJ SOLN: 6.2500 mg | INTRAMUSCULAR | Status: DC | PRN

## 2017-10-31 SURGICAL SUPPLY — 25 items
ADAPTER IRRIG TUBE 2 SPIKE SOL (ADAPTER) ×6 IMPLANT
BAG DRAIN CYSTO-URO LG1000N (MISCELLANEOUS) ×3 IMPLANT
BAG URO DRAIN 4000ML (MISCELLANEOUS) ×3 IMPLANT
CATH FOL 2WAY LX 24X30 (CATHETERS) IMPLANT
CATH FOL LEG HOLDER (MISCELLANEOUS) ×3 IMPLANT
CATH FOLEY 3WAY 30CC 22FR (CATHETERS) ×3 IMPLANT
DRAPE UTILITY 15X26 TOWEL STRL (DRAPES) ×3 IMPLANT
ELECT LOOP 22F BIPOLAR SML (ELECTROSURGICAL)
ELECTRODE LOOP 22F BIPOLAR SML (ELECTROSURGICAL) IMPLANT
FIBER LASER 550 (Laser) ×3 IMPLANT
GLOVE BIOGEL M 8.0 STRL (GLOVE) ×3 IMPLANT
GOWN STANDARD XL  REUSABL (MISCELLANEOUS) ×3 IMPLANT
GOWN STRL REUS W/ TWL LRG LVL3 (GOWN DISPOSABLE) ×4 IMPLANT
GOWN STRL REUS W/TWL LRG LVL3 (GOWN DISPOSABLE) ×2
HOLDER FOLEY CATH W/STRAP (MISCELLANEOUS) IMPLANT
KIT RM TURNOVER CYSTO AR (KITS) ×3 IMPLANT
LOOP CUT BIPOLAR 24F LRG (ELECTROSURGICAL) ×3 IMPLANT
PACK CYSTO AR (MISCELLANEOUS) ×3 IMPLANT
SET IRRIG Y TYPE TUR BLADDER L (SET/KITS/TRAYS/PACK) ×3 IMPLANT
SET IRRIGATING DISP (SET/KITS/TRAYS/PACK) ×3 IMPLANT
SOL .9 NS 3000ML IRR  AL (IV SOLUTION) ×4
SOL .9 NS 3000ML IRR UROMATIC (IV SOLUTION) ×8 IMPLANT
SYR TOOMEY 50ML (SYRINGE) ×3 IMPLANT
SYRINGE IRR TOOMEY STRL 70CC (SYRINGE) ×3 IMPLANT
WATER STERILE IRR 1000ML POUR (IV SOLUTION) ×3 IMPLANT

## 2017-10-31 NOTE — Anesthesia Procedure Notes (Signed)
Procedure Name: Intubation Date/Time: 10/31/2017 10:36 AM Performed by: Lowry Bowl, CRNA Pre-anesthesia Checklist: Patient identified, Emergency Drugs available, Suction available and Patient being monitored Patient Re-evaluated:Patient Re-evaluated prior to induction Oxygen Delivery Method: Circle system utilized Preoxygenation: Pre-oxygenation with 100% oxygen Induction Type: IV induction, Cricoid Pressure applied and Rapid sequence Ventilation: Mask ventilation without difficulty Laryngoscope Size: 4 and Mac Grade View: Grade II Tube type: Oral Tube size: 7.5 mm Number of attempts: 1 Airway Equipment and Method: Stylet Placement Confirmation: ETT inserted through vocal cords under direct vision,  positive ETCO2 and breath sounds checked- equal and bilateral Secured at: 23 cm Tube secured with: Tape Dental Injury: Teeth and Oropharynx as per pre-operative assessment

## 2017-10-31 NOTE — Anesthesia Post-op Follow-up Note (Signed)
Anesthesia QCDR form completed.        

## 2017-10-31 NOTE — Anesthesia Postprocedure Evaluation (Signed)
Anesthesia Post Note  Patient: Luis Patton  Procedure(s) Performed: TRANSURETHRAL RESECTION OF THE PROSTATE (TURP) (N/A Prostate) CYSTOSCOPY WITH LITHOLAPAXY (N/A Bladder)  Patient location during evaluation: PACU Anesthesia Type: General Level of consciousness: awake Pain management: pain level controlled Vital Signs Assessment: post-procedure vital signs reviewed and stable Cardiovascular status: stable Anesthetic complications: no     Last Vitals:  Vitals:   10/31/17 1327 10/31/17 1341  BP: 125/72 131/81  Pulse: 85 84  Resp: 17 13  Temp:  (!) 36.3 C  SpO2: 97% 98%    Last Pain:  Vitals:   10/31/17 1257  TempSrc:   PainSc: Asleep                 VAN STAVEREN,Schuyler Olden

## 2017-10-31 NOTE — Transfer of Care (Signed)
Immediate Anesthesia Transfer of Care Note  Patient: Luis Patton  Procedure(s) Performed: TRANSURETHRAL RESECTION OF THE PROSTATE (TURP) (N/A Prostate) CYSTOSCOPY WITH LITHOLAPAXY (N/A Bladder)  Patient Location: PACU  Anesthesia Type:General  Level of Consciousness: awake, oriented and patient cooperative  Airway & Oxygen Therapy: Patient Spontanous Breathing and Patient connected to face mask oxygen  Post-op Assessment: Report given to RN, Post -op Vital signs reviewed and stable and Patient moving all extremities  Post vital signs: Reviewed and stable  Last Vitals:  Vitals:   10/31/17 0915 10/31/17 1257  BP: 124/85 (!) 115/58  Pulse: 75 94  Resp: 16 18  Temp: 36.4 C (!) 36.3 C  SpO2: 99% 96%    Last Pain:  Vitals:   10/31/17 0915  TempSrc: Tympanic         Complications: No apparent anesthesia complications

## 2017-10-31 NOTE — Interval H&P Note (Signed)
History and Physical Interval Note:  10/31/2017 10:10 AM  Luis Patton  has presented today for surgery, with the diagnosis of Bladder stone, BPH  The various methods of treatment have been discussed with the patient and family. After consideration of risks, benefits and other options for treatment, the patient has consented to  Procedure(s): TRANSURETHRAL RESECTION OF THE PROSTATE (TURP) (N/A) CYSTOSCOPY WITH LITHOLAPAXY (N/A) as a surgical intervention .  The patient's history has been reviewed, patient examined, no change in status, stable for surgery.  I have reviewed the patient's chart and labs.  Questions were answered to the patient's satisfaction.     [place]

## 2017-10-31 NOTE — Anesthesia Preprocedure Evaluation (Signed)
Anesthesia Evaluation  Patient identified by MRN, date of birth, ID band Patient awake    Reviewed: Allergy & Precautions, NPO status , Patient's Chart, lab work & pertinent test results  History of Anesthesia Complications Negative for: history of anesthetic complications  Airway Mallampati: III  TM Distance: >3 FB Neck ROM: Full    Dental  (+) Poor Dentition   Pulmonary neg sleep apnea, neg COPD, former smoker,    breath sounds clear to auscultation- rhonchi (-) wheezing      Cardiovascular hypertension, Pt. on medications (-) CAD, (-) Past MI and (-) Cardiac Stents  Rhythm:Regular Rate:Normal - Systolic murmurs and - Diastolic murmurs    Neuro/Psych negative neurological ROS  negative psych ROS   GI/Hepatic negative GI ROS, Neg liver ROS,   Endo/Other  negative endocrine ROSneg diabetes  Renal/GU negative Renal ROS     Musculoskeletal  (+) Arthritis ,   Abdominal (+) + obese,   Peds  Hematology negative hematology ROS (+)   Anesthesia Other Findings Past Medical History: No date: DDD (degenerative disc disease), lumbar     Comment:  L4-5 No date: Hyperlipidemia No date: Hypertension   Reproductive/Obstetrics                             Anesthesia Physical Anesthesia Plan  ASA: II  Anesthesia Plan: General   Post-op Pain Management:    Induction: Intravenous  PONV Risk Score and Plan: 1 and Dexamethasone and Ondansetron  Airway Management Planned: Oral ETT  Additional Equipment:   Intra-op Plan:   Post-operative Plan: Extubation in OR  Informed Consent: I have reviewed the patients History and Physical, chart, labs and discussed the procedure including the risks, benefits and alternatives for the proposed anesthesia with the patient or authorized representative who has indicated his/her understanding and acceptance.   Dental advisory given  Plan Discussed with: CRNA  and Anesthesiologist  Anesthesia Plan Comments:         Anesthesia Quick Evaluation

## 2017-10-31 NOTE — Op Note (Signed)
Preoperative diagnosis: 1. Bladder outlet obstruction secondary to BPH 2. Bladder calculus   Postoperative diagnosis:  1. Bladder outlet obstruction secondary to BPH 2. Bladder calculus  Procedure:  1. Cystoscopy 2. Transurethral resection of the prostate 3. Cystolitholapaxy >2.5 cm  Surgeon: Nicki Reaper C. Stoioff M.D.  Anesthesia: General  Complications: None  EBL: Minimal  Specimens: 1. Prostate chips   Indication: Luis Patton is a patient with bladder outlet obstruction secondary to benign prostatic hyperplasia.  He has moderate to severe lower urinary tract symptoms.  He has approximately 3 cm jack stone bladder calculus.  After reviewing the management options for treatment, he elected to proceed with the above surgical procedure(s). We have discussed the potential benefits and risks of the procedure, side effects of the proposed treatment, the likelihood of the patient achieving the goals of the procedure, and any potential problems that might occur during the procedure or recuperation. Informed consent has been obtained.  Description of procedure:  The patient was taken to the operating room and general anesthesia was induced.  The patient was placed in the dorsal lithotomy position, prepped and draped in the usual sterile fashion, and preoperative antibiotics were administered. A preoperative time-out was performed.   Cystourethroscopy was performed.  The patient's urethra would not accept a 26 French continuous-flow resectoscope sheath and the meatus required dilation with [employer] sounds from 20-28 [place].  The resectoscope sheath with visual obturator was then passed per urethra.  No additional strictures were noted.  Lateral lobe enlargement with moderate bladder neck elevation was noted.  A median lobe was not present.  The calculus was identified at the bladder neck.    The bladder was then systematically examined in its entirety. There was no evidence of any bladder  tumors, stones, or other mucosal pathology.  There was a large posterior wall bladder diverticulum which was free of lesions.  There was also a moderate-sized left bladder wall diverticulum and scattered cellules and bladder trabeculation.  The ureteral orifices were identified and marked so as to be avoided during the procedure.  The prostate adenoma was then resected utilizing loop cautery resection with the bipolar cutting loop.  The prostate adenoma from the bladder neck back to the verumontanum was resected beginning at the six o'clock position and then extended to include the right and left lobes of the prostate and anterior prostate. Care was taken not to resect distal to the verumontanum.  Hemostasis was then achieved with the cautery and the bladder was emptied and reinspected with no significant bleeding noted at the end of the procedure.  All chips were removed with irrigation.  The adapter was placed in the resectoscope and a 550 m holmium laser fiber was placed through the working channel.  The injection arms were then fragmented at a setting of 0.2 J / 10 Hz and was subsequently increased to 20 Hz.  The body of the stone was hard and the power was increased to 1 J / 20 Hz.  All fragments were removed with irrigation.  There were additional stone fragments and chips noted in the posterior wall bladder diverticulum which were removed with irrigation.  The prostatic fossa was examined and hemostasis was noted to be adequate.  Several small stone fragments that had settled in the prostatic urethra were removed with irrigation.  A 3 way catheter was then placed into the bladder and placed on continuous bladder irrigation.  The patient appeared to tolerate the procedure well and without complications.  After  anesthetic reversal he was transported to the PACU in satisfactory condition.

## 2017-11-01 DIAGNOSIS — N401 Enlarged prostate with lower urinary tract symptoms: Secondary | ICD-10-CM | POA: Diagnosis not present

## 2017-11-01 LAB — SURGICAL PATHOLOGY

## 2017-11-01 MED ORDER — OXYCODONE-ACETAMINOPHEN 5-325 MG PO TABS: 1.0000 | ORAL_TABLET | ORAL | 0 refills | Status: DC | PRN

## 2017-11-01 MED ORDER — SULFAMETHOXAZOLE-TRIMETHOPRIM 800-160 MG PO TABS: 1.0000 | ORAL_TABLET | Freq: Two times a day (BID) | ORAL | 0 refills | Status: DC

## 2017-11-01 NOTE — Final Progress Note (Signed)
Voiding well Urine rose'  Plan: D/C

## 2017-11-01 NOTE — Progress Notes (Signed)
Pt discharged per MD order. IV removed. Prescription given to pt. Discharge instructions reviewed with pt. Questions answered to satisfaction. Pt taken to car in wheelchair by volunteer.

## 2017-11-01 NOTE — Progress Notes (Signed)
POD #1  No complaints Afebrile, vss  Abdomen soft, CBI effluent blood-tinged on low flow  Impression: Doing well status post cystolitholapaxy/TURP Plan: Discontinue CBI, if no significant hematuria will remove catheter midmorning and discharge.

## 2017-11-01 NOTE — Progress Notes (Signed)
Patient with continuous bladder irrigation with clear pink output. Patient complains of occasional bladder spasms but refuses opium-belladone suppository. No other complaints at this time. Will continue to monitor patient.

## 2017-11-01 NOTE — Discharge Summary (Signed)
Date of admission: 10/31/2017  Date of discharge: 11/01/2017  Admission diagnosis: BPH, bladder calculus  Discharge diagnosis: BPH, bladder calculus  Secondary diagnoses:  Patient Active Problem List   Diagnosis Date Noted  . S/P TURP 10/31/2017  . Varicose veins of both lower extremities with pain 09/29/2017  . Bilateral lower extremity edema 09/29/2017  . Essential hypertension 09/29/2017    History and Physical: For full details, please see admission history and physical. Briefly, Luis Patton is a 67 y.o. year old patient with BPH and a bladder calculus admitted for TURP/cystolitholapaxy.   Hospital Course: Patient tolerated the procedure well.  He was then transferred to the floor after an uneventful PACU stay.  His hospital course was uncomplicated.  On POD#1 he had met discharge criteria: was eating a regular diet, was up and ambulating independently,  pain was well controlled, was voiding without a catheter, and was ready to for discharge.  Foley catheter was removed prior to discharge.   Laboratory values:  No results for input(s): WBC, HGB, HCT in the last 72 hours. No results for input(s): NA, K, CL, CO2, GLUCOSE, BUN, CREATININE, CALCIUM in the last 72 hours. No results for input(s): LABPT, INR in the last 72 hours. No results for input(s): LABURIN in the last 72 hours. Results for orders placed or performed in visit on 10/19/17  Microscopic Examination     Status: Abnormal   Collection Time: 10/19/17  8:19 AM  Result Value Ref Range Status   WBC, UA 0-5 0 - 5 /hpf Final   RBC, UA >30 (H) 0 - 2 /hpf Final   Epithelial Cells (non renal) 0-10 0 - 10 /hpf Final   Casts Present (A) None seen /lpf Final   Cast Type Hyaline casts N/A Final   Mucus, UA Present (A) Not Estab. Final   Bacteria, UA Few (A) None seen/Few Final  Urine culture     Status: None   Collection Time: 10/19/17  9:15 AM  Result Value Ref Range Status   Urine Culture, Routine Final report  Final   Organism ID, Bacteria No growth  Final    Disposition: Home  Discharge instruction: The patient was instructed to be ambulatory but told to refrain from heavy lifting, strenuous activity, or driving.   Discharge medications:  Allergies as of 11/01/2017   No Known Allergies     Medication List    STOP taking these medications   acetaminophen 500 MG tablet Commonly known as:  TYLENOL   alfuzosin 10 MG 24 hr tablet Commonly known as:  UROXATRAL   aspirin EC 81 MG tablet   aspirin-acetaminophen-caffeine 250-250-65 MG tablet Commonly known as:  EXCEDRIN MIGRAINE   CRANBERRY PO     TAKE these medications   amLODipine 5 MG tablet Commonly known as:  NORVASC Take 5 mg daily by mouth.   benazepril 40 MG tablet Commonly known as:  LOTENSIN Take 40 mg daily by mouth.   Fish Oil 1000 MG Caps Take 1,000 mg daily by mouth.   Flaxseed Oil 1000 MG Caps Take 1,000 mg daily by mouth.   Garlic 416 MG Caps Take 500 mg daily by mouth.   hydrochlorothiazide 12.5 MG capsule Commonly known as:  MICROZIDE Take 12.5 mg daily by mouth.   multivitamin tablet Take 1 tablet by mouth daily.   omeprazole 20 MG capsule Commonly known as:  PRILOSEC Take 20 mg daily as needed by mouth.   OVER THE COUNTER MEDICATION Take 2 capsules daily by  mouth. Veggie Basics   oxyCODONE-acetaminophen 5-325 MG tablet Commonly known as:  PERCOCET/ROXICET Take 1 tablet by mouth every 4 (four) hours as needed for moderate pain.   PUMPKIN SEED PO Take 1,000 mg daily by mouth.   sulfamethoxazole-trimethoprim 800-160 MG tablet Commonly known as:  BACTRIM DS,SEPTRA DS Take 1 tablet by mouth 2 (two) times daily.   triamcinolone 55 MCG/ACT Aero nasal inhaler Commonly known as:  NASACORT Place 1 spray daily as needed into the nose (congestion).   Turmeric 400 MG Caps Take 800 mg daily by mouth.       Followup:

## 2017-11-01 NOTE — Care Management Obs Status (Signed)
[facility] NOTIFICATION   Patient Details  Name: Luis Patton MRN: [id number] Date of Birth: 03-Jul-1951   Medicare Observation Status Notification Given:  Yes    Beverly Sessions, RN 11/01/2017, 12:12 PM

## 2017-11-23 ENCOUNTER — Ambulatory Visit (INDEPENDENT_AMBULATORY_CARE_PROVIDER_SITE_OTHER): Payer: PPO | Admitting: Urology

## 2017-11-23 ENCOUNTER — Encounter: Payer: Self-pay | Admitting: Urology

## 2017-11-23 VITALS — BP 108/68 | HR 70 | Ht 74.0 in | Wt 260.0 lb

## 2017-11-23 DIAGNOSIS — Z9079 Acquired absence of other genital organ(s): Secondary | ICD-10-CM

## 2017-11-23 DIAGNOSIS — R310 Gross hematuria: Secondary | ICD-10-CM

## 2017-11-23 LAB — URINALYSIS, COMPLETE
Bilirubin, UA: NEGATIVE
GLUCOSE, UA: NEGATIVE
Ketones, UA: NEGATIVE
NITRITE UA: NEGATIVE
PH UA: 6 (ref 5.0–7.5)
Specific Gravity, UA: 1.01 (ref 1.005–1.030)
UUROB: 0.2 mg/dL (ref 0.2–1.0)

## 2017-11-23 LAB — MICROSCOPIC EXAMINATION: EPITHELIAL CELLS (NON RENAL): NONE SEEN /HPF (ref 0–10)

## 2017-11-23 NOTE — Progress Notes (Signed)
11/23/2017 1:12 PM   Luis Patton 1951-08-05 [id number]  Referring provider: Cletis Athens, MD [place], [place] [address]  Chief Complaint  Patient presents with  . Follow-up    post op    HPI: 66 year old male presents for postop follow-up.  He is status post TURP and cystolitholapaxy of a 3 cm bladder calculus on 10/31/2017.  His catheter was removed on postoperative day 1.  He states he is voiding with a good stream.  He had an episode of gross hematuria last week and this week he has had minimal initial hematuria.  He denies urinary frequency or urgency.  He does note occasional urge incontinence.  Pathology: Benign prostate tissue with focal chronic inflammation.  38 g of tissue resected.   PMH: Past Medical History:  Diagnosis Date  . DDD (degenerative disc disease), lumbar    L4-5  . Hyperlipidemia   . Hypertension     Surgical History: Past Surgical History:  Procedure Laterality Date  . CYSTOSCOPY WITH LITHOLAPAXY N/A 10/31/2017   Procedure: CYSTOSCOPY WITH LITHOLAPAXY;  Surgeon: Abbie Sons, MD;  Location: [facility] ORS;  Service: Urology;  Laterality: N/A;  . FINGER FRACTURE SURGERY    . HERNIA REPAIR    . TRANSURETHRAL RESECTION OF PROSTATE N/A 10/31/2017   Procedure: TRANSURETHRAL RESECTION OF THE PROSTATE (TURP);  Surgeon: Abbie Sons, MD;  Location: [facility] ORS;  Service: Urology;  Laterality: N/A;    Home Medications:  Allergies as of 11/23/2017   No Known Allergies     Medication List        Accurate as of 11/23/17  1:12 PM. Always use your most recent med list.          amLODipine 5 MG tablet Commonly known as:  NORVASC Take 5 mg daily by mouth.   benazepril 40 MG tablet Commonly known as:  LOTENSIN Take 40 mg daily by mouth.   Fish Oil 1000 MG Caps Take 1,000 mg daily by mouth.   Flaxseed Oil 1000 MG Caps Take 1,000 mg daily by mouth.   Garlic 536 MG Caps Take 500 mg daily by mouth.   hydrochlorothiazide  12.5 MG capsule Commonly known as:  MICROZIDE Take 12.5 mg daily by mouth.   multivitamin tablet Take 1 tablet by mouth daily.   omeprazole 20 MG capsule Commonly known as:  PRILOSEC Take 20 mg daily as needed by mouth.   OVER THE COUNTER MEDICATION Take 2 capsules daily by mouth. Veggie Basics   PUMPKIN SEED PO Take 1,000 mg daily by mouth.   triamcinolone 55 MCG/ACT Aero nasal inhaler Commonly known as:  NASACORT Place 1 spray daily as needed into the nose (congestion).   Turmeric 400 MG Caps Take 800 mg daily by mouth.       Allergies: No Known Allergies  Family History: Family History  Problem Relation Age of Onset  . Prostate cancer Neg Hx   . Bladder Cancer Neg Hx   . Kidney cancer Neg Hx     Social History:  reports that he quit smoking about 28 years ago. His smokeless tobacco use includes chew. He reports that he does not drink alcohol or use drugs.  ROS: UROLOGY Frequent Urination?: No Hard to postpone urination?: No Burning/pain with urination?: No Get up at night to urinate?: No Leakage of urine?: Yes Urine stream starts and stops?: No Trouble starting stream?: No Do you have to strain to urinate?: No Blood in urine?: Yes Urinary tract infection?:  No Sexually transmitted disease?: No Injury to kidneys or bladder?: No Painful intercourse?: No Weak stream?: No Erection problems?: No Penile pain?: No  Gastrointestinal Nausea?: No Vomiting?: No Indigestion/heartburn?: No Diarrhea?: No Constipation?: No  Constitutional Fever: No Night sweats?: No Weight loss?: No Fatigue?: No  Skin Skin rash/lesions?: No Itching?: No  Eyes Blurred vision?: No Double vision?: No  Ears/Nose/Throat Sore throat?: No Sinus problems?: No  Hematologic/Lymphatic Swollen glands?: No Easy bruising?: No  Cardiovascular Leg swelling?: No Chest pain?: No  Respiratory Cough?: No Shortness of breath?: No  Endocrine Excessive thirst?:  No  Musculoskeletal Back pain?: No Joint pain?: No  Neurological Headaches?: No Dizziness?: No  Psychologic Depression?: No Anxiety?: No  Physical Exam: BP 108/68   Pulse 70   Ht 6\' 2"  (1.88 m)   Wt 260 lb (117.9 kg)   BMI 33.38 kg/m   Constitutional:  Alert and oriented, No acute distress. HEENT: [place] AT, moist mucus membranes.  Trachea midline, no masses. Cardiovascular: No clubbing, cyanosis, or edema. Respiratory: Normal respiratory effort, no increased work of breathing. Skin: No rashes, bruises or suspicious lesions. Lymph: No cervical or inguinal adenopathy. Neurologic: Grossly intact, no focal deficits, moving all 4 extremities. Psychiatric: Normal mood and affect.  Laboratory Data:  Lab Results  Component Value Date   CREATININE 1.30 (H) 10/17/2017    Assessment & Plan:   1. S/P TURP Doing well.  He may ease back into regular activities in 1 week.  Follow-up 3 months  - Urinalysis, Complete   Return in about 3 months (around 02/21/2018) for Recheck.    Abbie Sons, [place] [address], [place], [place] [address] [phone]

## 2017-12-15 ENCOUNTER — Telehealth: Payer: Self-pay | Admitting: Gastroenterology

## 2017-12-15 ENCOUNTER — Other Ambulatory Visit: Payer: Self-pay

## 2017-12-15 DIAGNOSIS — K639 Disease of intestine, unspecified: Secondary | ICD-10-CM

## 2017-12-15 NOTE — Telephone Encounter (Signed)
Gastroenterology Pre-Procedure Review  Request Date:   Requesting Physician: Dr.    PATIENT REVIEW QUESTIONS: The patient responded to the following health history questions as indicated:    1. Are you having any GI issues? no 2. Do you have a personal history of Polyps? no 3. Do you have a family history of Colon Cancer or Polyps? no 4. Diabetes Mellitus? no 5. Joint replacements in the past 12 months?no 6. Major health problems in the past 3 months?yes (bladder tack) 7. Any artificial heart valves, MVP, or defibrillator?no    MEDICATIONS & ALLERGIES:    Patient reports the following regarding taking any anticoagulation/antiplatelet therapy:   Plavix, Coumadin, Eliquis, Xarelto, Lovenox, Pradaxa, Brilinta, or Effient? no Aspirin? yes (ASA 81 mg)  Patient confirms/reports the following medications:  Current Outpatient Medications  Medication Sig Dispense Refill   amLODipine (NORVASC) 5 MG tablet Take 5 mg daily by mouth.      benazepril (LOTENSIN) 40 MG tablet Take 40 mg daily by mouth.      Flaxseed, Linseed, (FLAXSEED OIL) 1000 MG CAPS Take 1,000 mg daily by mouth.     Garlic 625 MG CAPS Take 500 mg daily by mouth.     hydrochlorothiazide (MICROZIDE) 12.5 MG capsule Take 12.5 mg daily by mouth.      Multiple Vitamin (MULTIVITAMIN) tablet Take 1 tablet by mouth daily.     Omega-3 Fatty Acids (FISH OIL) 1000 MG CAPS Take 1,000 mg daily by mouth.     omeprazole (PRILOSEC) 20 MG capsule Take 20 mg daily as needed by mouth.     OVER THE COUNTER MEDICATION Take 2 capsules daily by mouth. Veggie Basics     PUMPKIN SEED PO Take 1,000 mg daily by mouth.     triamcinolone (NASACORT) 55 MCG/ACT AERO nasal inhaler Place 1 spray daily as needed into the nose (congestion).      Turmeric 400 MG CAPS Take 800 mg daily by mouth.     No current facility-administered medications for this visit.     Patient confirms/reports the following allergies:  No Known Allergies  No orders  of the defined types were placed in this encounter.   AUTHORIZATION INFORMATION Primary Insurance: 1D#: Group #:  Secondary Insurance: 1D#: Group #:  SCHEDULE INFORMATION: Date:  Time: Location:

## 2017-12-29 ENCOUNTER — Ambulatory Visit (INDEPENDENT_AMBULATORY_CARE_PROVIDER_SITE_OTHER): Payer: PPO | Admitting: Vascular Surgery

## 2017-12-29 ENCOUNTER — Encounter (INDEPENDENT_AMBULATORY_CARE_PROVIDER_SITE_OTHER): Payer: PPO

## 2018-01-08 ENCOUNTER — Ambulatory Visit: Payer: Medicare HMO | Admitting: Anesthesiology

## 2018-01-08 ENCOUNTER — Inpatient Hospital Stay (HOSPITAL_COMMUNITY)
Admission: RE | Admit: 2018-01-08 | Discharge: 2018-01-08 | Disposition: A | Discharge status: Home / Self Care | Payer: Medicare HMO | Source: Ambulatory Visit | Attending: Internal Medicine | Admitting: Internal Medicine

## 2018-01-08 ENCOUNTER — Inpatient Hospital Stay
Admission: AD | Admit: 2018-01-08 | Discharge: 2018-01-10 | DRG: 309 | Disposition: A | Discharge status: Home / Self Care | Payer: Medicare HMO | Source: Ambulatory Visit | Attending: Internal Medicine | Admitting: Internal Medicine

## 2018-01-08 ENCOUNTER — Encounter: Payer: Self-pay | Admitting: *Deleted

## 2018-01-08 ENCOUNTER — Other Ambulatory Visit: Payer: Self-pay

## 2018-01-08 ENCOUNTER — Encounter
Admission: AD | Disposition: A | Discharge status: Home / Self Care | Payer: Self-pay | Source: Ambulatory Visit | Attending: Internal Medicine

## 2018-01-08 DIAGNOSIS — Z9079 Acquired absence of other genital organ(s): Secondary | ICD-10-CM

## 2018-01-08 DIAGNOSIS — R002 Palpitations: Secondary | ICD-10-CM | POA: Diagnosis present

## 2018-01-08 DIAGNOSIS — D122 Benign neoplasm of ascending colon: Secondary | ICD-10-CM | POA: Diagnosis present

## 2018-01-08 DIAGNOSIS — N400 Enlarged prostate without lower urinary tract symptoms: Secondary | ICD-10-CM | POA: Diagnosis present

## 2018-01-08 DIAGNOSIS — Z79899 Other long term (current) drug therapy: Secondary | ICD-10-CM | POA: Diagnosis not present

## 2018-01-08 DIAGNOSIS — Z1211 Encounter for screening for malignant neoplasm of colon: Secondary | ICD-10-CM | POA: Diagnosis not present

## 2018-01-08 DIAGNOSIS — K5790 Diverticulosis of intestine, part unspecified, without perforation or abscess without bleeding: Secondary | ICD-10-CM | POA: Diagnosis not present

## 2018-01-08 DIAGNOSIS — E785 Hyperlipidemia, unspecified: Secondary | ICD-10-CM | POA: Diagnosis present

## 2018-01-08 DIAGNOSIS — I4891 Unspecified atrial fibrillation: Secondary | ICD-10-CM | POA: Diagnosis present

## 2018-01-08 DIAGNOSIS — R000 Tachycardia, unspecified: Secondary | ICD-10-CM | POA: Diagnosis not present

## 2018-01-08 DIAGNOSIS — I878 Other specified disorders of veins: Secondary | ICD-10-CM | POA: Diagnosis present

## 2018-01-08 DIAGNOSIS — Z823 Family history of stroke: Secondary | ICD-10-CM | POA: Diagnosis not present

## 2018-01-08 DIAGNOSIS — I10 Essential (primary) hypertension: Secondary | ICD-10-CM | POA: Diagnosis present

## 2018-01-08 DIAGNOSIS — E860 Dehydration: Secondary | ICD-10-CM | POA: Diagnosis present

## 2018-01-08 DIAGNOSIS — I480 Paroxysmal atrial fibrillation: Principal | ICD-10-CM | POA: Diagnosis present

## 2018-01-08 DIAGNOSIS — K640 First degree hemorrhoids: Secondary | ICD-10-CM | POA: Diagnosis not present

## 2018-01-08 DIAGNOSIS — M5136 Other intervertebral disc degeneration, lumbar region: Secondary | ICD-10-CM | POA: Diagnosis not present

## 2018-01-08 DIAGNOSIS — K639 Disease of intestine, unspecified: Secondary | ICD-10-CM

## 2018-01-08 DIAGNOSIS — F17220 Nicotine dependence, chewing tobacco, uncomplicated: Secondary | ICD-10-CM | POA: Diagnosis present

## 2018-01-08 DIAGNOSIS — Z6833 Body mass index (BMI) 33.0-33.9, adult: Secondary | ICD-10-CM | POA: Diagnosis not present

## 2018-01-08 DIAGNOSIS — I8390 Asymptomatic varicose veins of unspecified lower extremity: Secondary | ICD-10-CM | POA: Diagnosis not present

## 2018-01-08 DIAGNOSIS — K635 Polyp of colon: Secondary | ICD-10-CM | POA: Diagnosis not present

## 2018-01-08 DIAGNOSIS — Z8249 Family history of ischemic heart disease and other diseases of the circulatory system: Secondary | ICD-10-CM

## 2018-01-08 DIAGNOSIS — Q446 Cystic disease of liver: Secondary | ICD-10-CM

## 2018-01-08 DIAGNOSIS — D126 Benign neoplasm of colon, unspecified: Secondary | ICD-10-CM | POA: Diagnosis not present

## 2018-01-08 DIAGNOSIS — M1990 Unspecified osteoarthritis, unspecified site: Secondary | ICD-10-CM | POA: Diagnosis present

## 2018-01-08 DIAGNOSIS — E669 Obesity, unspecified: Secondary | ICD-10-CM | POA: Diagnosis present

## 2018-01-08 DIAGNOSIS — K5730 Diverticulosis of large intestine without perforation or abscess without bleeding: Secondary | ICD-10-CM

## 2018-01-08 DIAGNOSIS — K648 Other hemorrhoids: Secondary | ICD-10-CM | POA: Diagnosis not present

## 2018-01-08 DIAGNOSIS — Z7982 Long term (current) use of aspirin: Secondary | ICD-10-CM | POA: Diagnosis not present

## 2018-01-08 DIAGNOSIS — Z7951 Long term (current) use of inhaled steroids: Secondary | ICD-10-CM

## 2018-01-08 DIAGNOSIS — D175 Benign lipomatous neoplasm of intra-abdominal organs: Secondary | ICD-10-CM | POA: Diagnosis present

## 2018-01-08 HISTORY — DX: Other specified disorders of veins: I87.8

## 2018-01-08 HISTORY — PX: COLONOSCOPY WITH PROPOFOL: SHX5780

## 2018-01-08 HISTORY — DX: Benign prostatic hyperplasia without lower urinary tract symptoms: N40.0

## 2018-01-08 HISTORY — DX: Personal history of other medical treatment: Z92.89

## 2018-01-08 HISTORY — DX: Calculus in bladder: N21.0

## 2018-01-08 HISTORY — DX: Cystic disease of liver: Q44.6

## 2018-01-08 HISTORY — DX: Asymptomatic varicose veins of unspecified lower extremity: I83.90

## 2018-01-08 HISTORY — DX: Palpitations: R00.2

## 2018-01-08 LAB — BASIC METABOLIC PANEL
Anion gap: 10 (ref 5–15)
BUN: 12 mg/dL (ref 6–20)
CHLORIDE: 103 mmol/L (ref 101–111)
CO2: 25 mmol/L (ref 22–32)
CREATININE: 1.05 mg/dL (ref 0.61–1.24)
Calcium: 8.8 mg/dL — ABNORMAL LOW (ref 8.9–10.3)
GFR calc Af Amer: >60 mL/min (ref >60)
GFR calc non Af Amer: >60 mL/min (ref >60)
GLUCOSE: 130 mg/dL — AB (ref 65–99)
POTASSIUM: 4.3 mmol/L (ref 3.5–5.1)
Sodium: 138 mmol/L (ref 135–145)

## 2018-01-08 LAB — CBC
HCT: 44.2 % (ref 40.0–52.0)
Hemoglobin: 15 g/dL (ref 13.0–18.0)
MCH: 30.7 pg (ref 26.0–34.0)
MCHC: 33.9 g/dL (ref 32.0–36.0)
MCV: 90.6 fL (ref 80.0–100.0)
PLATELETS: 233 10*3/uL (ref 150–440)
RBC: 4.88 MIL/uL (ref 4.40–5.90)
RDW: 12.4 % (ref 11.5–14.5)
WBC: 6.4 10*3/uL (ref 3.8–10.6)

## 2018-01-08 LAB — TSH: TSH: 1.041 u[IU]/mL (ref 0.350–4.500)

## 2018-01-08 LAB — ECHOCARDIOGRAM COMPLETE
Height: 74 in
Weight: 4160 oz

## 2018-01-08 LAB — TROPONIN I
Troponin I: <0.03 ng/mL (ref <0.03)
Troponin I: <0.03 ng/mL (ref <0.03)

## 2018-01-08 LAB — MAGNESIUM: Magnesium: 2 mg/dL (ref 1.7–2.4)

## 2018-01-08 SURGERY — COLONOSCOPY WITH PROPOFOL
Anesthesia: General

## 2018-01-08 MED ORDER — SODIUM CHLORIDE 0.9% FLUSH
3.0000 mL | Freq: Two times a day (BID) | INTRAVENOUS | Status: DC
  Administered 2018-01-08 – 2018-01-10 (×5): 3 mL via INTRAVENOUS

## 2018-01-08 MED ORDER — METOPROLOL TARTRATE 25 MG PO TABS
ORAL_TABLET | ORAL | Status: AC
  Administered 2018-01-08: 25 mg via ORAL
  Filled 2018-01-08: qty 1

## 2018-01-08 MED ORDER — ONDANSETRON HCL 4 MG/2ML IJ SOLN: 4.0000 mg | Freq: Four times a day (QID) | INTRAMUSCULAR | Status: DC | PRN

## 2018-01-08 MED ORDER — METOPROLOL TARTRATE 25 MG PO TABS
ORAL_TABLET | ORAL | Status: AC
  Filled 2018-01-08: qty 1

## 2018-01-08 MED ORDER — PROPOFOL 10 MG/ML IV BOLUS
INTRAVENOUS | Status: DC | PRN
  Administered 2018-01-08: 100 mg via INTRAVENOUS

## 2018-01-08 MED ORDER — ENOXAPARIN SODIUM 40 MG/0.4ML ~~LOC~~ SOLN
40.0000 mg | SUBCUTANEOUS | Status: DC
  Administered 2018-01-09: 40 mg via SUBCUTANEOUS
  Filled 2018-01-08: qty 0.4

## 2018-01-08 MED ORDER — PROPOFOL 500 MG/50ML IV EMUL
INTRAVENOUS | Status: DC | PRN
  Administered 2018-01-08: 150 ug/kg/min via INTRAVENOUS

## 2018-01-08 MED ORDER — ACETAMINOPHEN 325 MG PO TABS: 650.0000 mg | ORAL_TABLET | Freq: Four times a day (QID) | ORAL | Status: DC | PRN

## 2018-01-08 MED ORDER — SODIUM CHLORIDE 0.9 % IV SOLN
INTRAVENOUS | Status: DC
  Administered 2018-01-08: 1000 mL via INTRAVENOUS

## 2018-01-08 MED ORDER — ACETAMINOPHEN 650 MG RE SUPP: 650.0000 mg | Freq: Four times a day (QID) | RECTAL | Status: DC | PRN

## 2018-01-08 MED ORDER — ALBUTEROL SULFATE (2.5 MG/3ML) 0.083% IN NEBU
2.5000 mg | INHALATION_SOLUTION | RESPIRATORY_TRACT | Status: DC | PRN
  Filled 2018-01-08: qty 3

## 2018-01-08 MED ORDER — METOPROLOL TARTRATE 25 MG PO TABS
25.0000 mg | ORAL_TABLET | ORAL | Status: AC
  Administered 2018-01-08: 25 mg via ORAL
  Filled 2018-01-08: qty 1

## 2018-01-08 MED ORDER — ONDANSETRON HCL 4 MG PO TABS: 4.0000 mg | ORAL_TABLET | Freq: Four times a day (QID) | ORAL | Status: DC | PRN

## 2018-01-08 MED ORDER — METOPROLOL TARTRATE 25 MG PO TABS
25.0000 mg | ORAL_TABLET | Freq: Two times a day (BID) | ORAL | Status: DC
  Administered 2018-01-08 – 2018-01-10 (×4): 25 mg via ORAL
  Filled 2018-01-08 (×3): qty 1

## 2018-01-08 MED ORDER — POLYETHYLENE GLYCOL 3350 17 G PO PACK: 17.0000 g | PACK | Freq: Every day | ORAL | Status: DC | PRN

## 2018-01-08 NOTE — Plan of Care (Signed)
  Clinical Measurements: Cardiovascular complication will be avoided 01/08/2018 1838 - Progressing by Daron Offer, RN Note Patient now on Metoprolol r/t previous A-Fib R.V.R. Rate now controlled. Will continue to monitor heart rate and rhythm. Wenda Low [facility]

## 2018-01-08 NOTE — Anesthesia Procedure Notes (Signed)
Date/Time: 01/08/2018 11:14 AM Performed by: Nelda Marseille, CRNA Pre-anesthesia Checklist: Patient identified, Emergency Drugs available, Suction available, Patient being monitored and Timeout performed Oxygen Delivery Method: Nasal cannula

## 2018-01-08 NOTE — Transfer of Care (Signed)
Immediate Anesthesia Transfer of Care Note  Patient: Luis Patton  Procedure(s) Performed: COLONOSCOPY WITH PROPOFOL (N/A )  Patient Location: PACU  Anesthesia Type:General  Level of Consciousness: sedated  Airway & Oxygen Therapy: Patient Spontanous Breathing and Patient connected to nasal cannula oxygen  Post-op Assessment: Report given to RN and Post -op Vital signs reviewed and stable  Post vital signs: Reviewed and stable  Last Vitals:  Vitals:   01/08/18 1002 01/08/18 1132  BP: 136/82 103/74  Pulse: 81   Resp: 18   Temp: 37.2 C 36.4 C  SpO2: 100%     Last Pain:  Vitals:   01/08/18 1132  TempSrc: Tympanic  PainSc: Asleep         Complications: No apparent anesthesia complications

## 2018-01-08 NOTE — Care Management (Signed)
New A fib noted post op. Cardiac consult with Arida.

## 2018-01-08 NOTE — Anesthesia Post-op Follow-up Note (Signed)
Anesthesia QCDR form completed.        

## 2018-01-08 NOTE — Anesthesia Preprocedure Evaluation (Signed)
Anesthesia Evaluation  Patient identified by MRN, date of birth, ID band Patient awake    Reviewed: Allergy & Precautions, NPO status , Patient's Chart, lab work & pertinent test results, reviewed documented beta blocker date and time   Airway Mallampati: III  TM Distance: >3 FB     Dental  (+) Chipped   Pulmonary former smoker,           Cardiovascular hypertension, Pt. on medications + Peripheral Vascular Disease       Neuro/Psych    GI/Hepatic   Endo/Other    Renal/GU      Musculoskeletal  (+) Arthritis ,   Abdominal   Peds  Hematology   Anesthesia Other Findings   Reproductive/Obstetrics                             Anesthesia Physical Anesthesia Plan  ASA: III  Anesthesia Plan: General   Post-op Pain Management:    Induction: Intravenous  PONV Risk Score and Plan:   Airway Management Planned:   Additional Equipment:   Intra-op Plan:   Post-operative Plan:   Informed Consent: I have reviewed the patients History and Physical, chart, labs and discussed the procedure including the risks, benefits and alternatives for the proposed anesthesia with the patient or authorized representative who has indicated his/her understanding and acceptance.     Plan Discussed with: CRNA  Anesthesia Plan Comments:         Anesthesia Quick Evaluation

## 2018-01-08 NOTE — H&P (Signed)
Luis Bellows, MD [address], [place], [place], [place], [id number], [place], [place], [place], [address] Phone: [phone]  Fax: [phone]  Primary Care Physician:  Cletis Athens, MD   Pre-Procedure History & Physical: HPI:  Luis Patton is a 67 y.o. male is here for an colonoscopy.   Past Medical History:  Diagnosis Date  . DDD (degenerative disc disease), lumbar    L4-5  . Hyperlipidemia   . Hypertension     Past Surgical History:  Procedure Laterality Date  . CYSTOSCOPY WITH LITHOLAPAXY N/A 10/31/2017   Procedure: CYSTOSCOPY WITH LITHOLAPAXY;  Surgeon: Abbie Sons, MD;  Location: [facility];  Service: Urology;  Laterality: N/A;  . FINGER FRACTURE SURGERY    . HERNIA REPAIR    . TONSILLECTOMY    . TRANSURETHRAL RESECTION OF PROSTATE N/A 10/31/2017   Procedure: TRANSURETHRAL RESECTION OF THE PROSTATE (TURP);  Surgeon: Abbie Sons, MD;  Location: [facility];  Service: Urology;  Laterality: N/A;    Prior to Admission medications   Medication Sig Start Date End Date Taking? Authorizing Provider  Flaxseed, Linseed, (FLAXSEED OIL) 1000 MG CAPS Take 1,000 mg daily by mouth.   Yes [provider]  Omega-3 Fatty Acids (FISH OIL) 1000 MG CAPS Take 1,000 mg daily by mouth.   Yes [provider]  omeprazole (PRILOSEC) 20 MG capsule Take 20 mg daily as needed by mouth.   Yes [provider]  OVER THE COUNTER MEDICATION Take 2 capsules daily by mouth. Veggie Basics   Yes [provider]  PUMPKIN SEED PO Take 1,000 mg daily by mouth.   Yes [provider]  Turmeric 400 MG CAPS Take 800 mg daily by mouth.   Yes [provider]  amLODipine (NORVASC) 5 MG tablet Take 5 mg daily by mouth.     [provider]  benazepril (LOTENSIN) 40 MG tablet Take 40 mg daily by mouth.     [provider]  Garlic 629 MG CAPS Take 500 mg daily by mouth.    [provider]    hydrochlorothiazide (MICROZIDE) 12.5 MG capsule Take 12.5 mg daily by mouth.  06/04/17   [provider]  Multiple Vitamin (MULTIVITAMIN) tablet Take 1 tablet by mouth daily.    [provider]  triamcinolone (NASACORT) 55 MCG/ACT AERO nasal inhaler Place 1 spray daily as needed into the nose (congestion).     [provider]    Allergies as of 12/15/2017  . (No Known Allergies)    Family History  Problem Relation Age of Onset  . Prostate cancer Neg Hx   . Bladder Cancer Neg Hx   . Kidney cancer Neg Hx     Social History   Socioeconomic History  . Marital status: Married    Spouse name: Not on file  . Number of children: Not on file  . Years of education: Not on file  . Highest education level: Not on file  Social Needs  . Financial resource strain: Not on file  . Food insecurity - worry: Not on file  . Food insecurity - inability: Not on file  . Transportation needs - medical: Not on file  . Transportation needs - non-medical: Not on file  Occupational History  . Not on file  Tobacco Use  . Smoking status: Former Smoker    Last attempt to quit: 10/27/1989    Years since quitting: 28.2  . Smokeless tobacco: Current User  Types: Chew  Substance and Sexual Activity  . Alcohol use: No  . Drug use: No  . Sexual activity: Not on file  Other Topics Concern  . Not on file  Social History Narrative  . Not on file    Review of Systems: See HPI, otherwise negative ROS  Physical Exam: BP 136/82   Pulse 81   Temp 98.9 F (37.2 C) (Tympanic)   Resp 18   Ht 6\' 2"  (1.88 m)   Wt 260 lb (117.9 kg)   SpO2 100%   BMI 33.38 kg/m  General:   Alert,  pleasant and cooperative in NAD Head:  Normocephalic and atraumatic. Neck:  Supple; no masses or thyromegaly. Lungs:  Clear throughout to auscultation, normal respiratory effort.    Heart:  +S1, +S2, Regular rate and rhythm, No edema. Abdomen:  Soft, nontender and nondistended. Normal bowel  sounds, without guarding, and without rebound.   Neurologic:  Alert and  oriented x4;  grossly normal neurologically.  Impression/Plan: Luis Patton is here for an colonoscopy to be performed for Screening colonoscopy average risk   Risks, benefits, limitations, and alternatives regarding  colonoscopy have been reviewed with the patient.  Questions have been answered.  All parties agreeable.   Luis Bellows, MD  01/08/2018, 11:01 AM

## 2018-01-08 NOTE — OR Nursing (Signed)
DR Howard Pouch in talked with patient

## 2018-01-08 NOTE — Anesthesia Postprocedure Evaluation (Signed)
Anesthesia Post Note  Patient: Luis Patton  Procedure(s) Performed: COLONOSCOPY WITH PROPOFOL (N/A )  Patient location during evaluation: Endoscopy Anesthesia Type: General Level of consciousness: awake and alert Pain management: pain level controlled Vital Signs Assessment: post-procedure vital signs reviewed and stable Respiratory status: spontaneous breathing, nonlabored ventilation, respiratory function stable and patient connected to nasal cannula oxygen Cardiovascular status: blood pressure returned to baseline and stable Postop Assessment: no apparent nausea or vomiting Anesthetic complications: no     Last Vitals:  Vitals:   01/08/18 1240 01/08/18 1310  BP: 130/83 127/73  Pulse: (!) 112 89  Resp: 18 12  Temp:    SpO2: 99% 100%    Last Pain:  Vitals:   01/08/18 1142  TempSrc:   PainSc: 0-No pain                 Dominyk Law S

## 2018-01-08 NOTE — H&P (Signed)
[facility] NAME: Luis Patton    MR#:  [id number]  DATE OF BIRTH:  01-20-1951  DATE OF ADMISSION:  01/08/2018  PRIMARY CARE PHYSICIAN: Cletis Athens, MD   REQUESTING/REFERRING PHYSICIAN: Dr. Vicente Males  CHIEF COMPLAINT:  No chief complaint on file.  Tachycardia  HISTORY OF PRESENT ILLNESS:  Luis Patton  is a 67 y.o. male with a known history of HTN, varicose veins, colon polyps who was here in the endoscopy suite for an elective colon polypectomy had episode of tachycardia.  Went as high as 160s.  New-onset atrial fibrillation.  He does not complain of any chest pain.  Does have fatigue, palpitations.  Patient will need admission to the hospital for rate control and starting anticoagulation and further workup.:  Health cardiology team has seen the patient. No history of GI bleed.  He takes baby aspirin daily at home.  No thyroid problems.  PAST MEDICAL HISTORY:   Past Medical History:  Diagnosis Date  . Bladder calculi    a. 10/2017 s/p cystolitholapaxy  . BPH (benign prostatic hyperplasia)    a. 10/2017 s/p TURP.  Marland Kitchen Chronic venous stasis   . DDD (degenerative disc disease), lumbar    L4-5  . History of echocardiogram    a. Reportedly nl.  . History of stress test    a. Reportedly nl.  . Hyperlipidemia   . Hypertension   . Palpitations    a. Has previously worn monitor w/o findings ([place]).  . Polycystic liver disease   . Varicose veins of lower extremity     PAST SURGICAL HISTORY:   Past Surgical History:  Procedure Laterality Date  . CYSTOSCOPY WITH LITHOLAPAXY N/A 10/31/2017   Procedure: CYSTOSCOPY WITH LITHOLAPAXY;  Surgeon: Abbie Sons, MD;  Location: [facility];  Service: Urology;  Laterality: N/A;  . FINGER FRACTURE SURGERY    . HERNIA REPAIR    . TONSILLECTOMY    . TRANSURETHRAL RESECTION OF PROSTATE N/A 10/31/2017   Procedure: TRANSURETHRAL RESECTION OF THE PROSTATE (TURP);  Surgeon: Abbie Sons, MD;   Location: [facility];  Service: Urology;  Laterality: N/A;    SOCIAL HISTORY:   Social History   Tobacco Use  . Smoking status: Former Smoker    Last attempt to quit: 10/27/1989    Years since quitting: 28.2  . Smokeless tobacco: Current User    Types: Chew  Substance Use Topics  . Alcohol use: No    FAMILY HISTORY:   Family History  Problem Relation Age of Onset  . Stroke Mother        died @ 50  . Congestive Heart Failure Father        died @ 48  . Prostate cancer Neg Hx   . Bladder Cancer Neg Hx   . Kidney cancer Neg Hx     DRUG ALLERGIES:  No Known Allergies  REVIEW OF SYSTEMS:   Review of Systems  Constitutional: Positive for malaise/fatigue. Negative for chills and fever.  HENT: Negative for sore throat.   Eyes: Negative for blurred vision, double vision and pain.  Respiratory: Negative for cough, hemoptysis, shortness of breath and wheezing.   Cardiovascular: Negative for chest pain, palpitations, orthopnea and leg swelling.  Gastrointestinal: Negative for abdominal pain, constipation, diarrhea, heartburn, nausea and vomiting.  Genitourinary: Negative for dysuria and hematuria.  Musculoskeletal: Negative for back pain and joint pain.  Skin: Negative for rash.  Neurological: Positive for weakness. Negative for sensory change,  speech change, focal weakness and headaches.  Endo/Heme/Allergies: Does not bruise/bleed easily.  Psychiatric/Behavioral: Negative for depression. The patient is not nervous/anxious.    MEDICATIONS AT HOME:   Prior to Admission medications   Medication Sig Start Date End Date Taking? Authorizing Provider  Flaxseed, Linseed, (FLAXSEED OIL) 1000 MG CAPS Take 1,000 mg daily by mouth.   Yes [provider]  Omega-3 Fatty Acids (FISH OIL) 1000 MG CAPS Take 1,000 mg daily by mouth.   Yes [provider]  omeprazole (PRILOSEC) 20 MG capsule Take 20 mg daily as needed by mouth.   Yes [provider]  OVER THE COUNTER  MEDICATION Take 2 capsules daily by mouth. Veggie Basics   Yes [provider]  PUMPKIN SEED PO Take 1,000 mg daily by mouth.   Yes [provider]  Turmeric 400 MG CAPS Take 800 mg daily by mouth.   Yes [provider]  amLODipine (NORVASC) 5 MG tablet Take 5 mg daily by mouth.     [provider]  benazepril (LOTENSIN) 40 MG tablet Take 40 mg daily by mouth.     [provider]  Garlic 353 MG CAPS Take 500 mg daily by mouth.    [provider]  hydrochlorothiazide (MICROZIDE) 12.5 MG capsule Take 12.5 mg daily by mouth.  06/04/17   [provider]  Multiple Vitamin (MULTIVITAMIN) tablet Take 1 tablet by mouth daily.    [provider]  triamcinolone (NASACORT) 55 MCG/ACT AERO nasal inhaler Place 1 spray daily as needed into the nose (congestion).     [provider]     VITAL SIGNS:  Blood pressure 127/73, pulse 89, temperature 97.6 F (36.4 C), temperature source Tympanic, resp. rate 12, height 6\' 2"  (1.88 m), weight 117.9 kg (260 lb), SpO2 100 %.  PHYSICAL EXAMINATION:  Physical Exam  GENERAL:  67 y.o.-year-old patient lying in the bed with no acute distress.  EYES: Pupils equal, round, reactive to light and accommodation. No scleral icterus. Extraocular muscles intact.  HEENT: Head atraumatic, normocephalic. Oropharynx and nasopharynx clear. No oropharyngeal erythema, moist oral mucosa  NECK:  Supple, no jugular venous distention. No thyroid enlargement, no tenderness.  LUNGS: Normal breath sounds bilaterally, no wheezing, rales, rhonchi. No use of accessory muscles of respiration.  CARDIOVASCULAR: Tachycardia.  Irregularly irregular ABDOMEN: Soft, nontender, nondistended. Bowel sounds present. No organomegaly or mass.  EXTREMITIES: No pedal edema, cyanosis, or clubbing. + 2 pedal & radial pulses b/l. NEUROLOGIC: Cranial nerves II through XII are intact. No focal Motor or sensory deficits appreciated  b/l PSYCHIATRIC: The patient is alert and oriented x 3. Good affect.  SKIN: No obvious rash, lesion, or ulcer.   LABORATORY PANEL:   CBC Recent Labs  Lab 01/08/18 1311  WBC 6.4  HGB 15.0  HCT 44.2  PLT 233   ------------------------------------------------------------------------------------------------------------------  Chemistries  No results for input(s): NA, K, CL, CO2, GLUCOSE, BUN, CREATININE, CALCIUM, MG, AST, ALT, ALKPHOS, BILITOT in the last 168 hours.  Invalid input(s): GFRCGP ------------------------------------------------------------------------------------------------------------------  Cardiac Enzymes No results for input(s): TROPONINI in the last 168 hours. ------------------------------------------------------------------------------------------------------------------  RADIOLOGY:  No results found.   IMPRESSION AND PLAN:   *Atrial fibrillation with rapid ventricular rate.  Patient continues to have heart rate in the 130s at this time.  We will give him stat dose of metoprolol.  Continue this twice daily.  Check TSH, troponin, BMP, magnesium.  Check echocardiogram.  Cardiology consult.  Discussed with Dr. Fletcher Anon. Can start anticoagulation likely tomorrow  after consulting GI since he had polypectomy today.  *Hypertension.  Started on metoprolol.  Hold amlodipine and benazepril at this time.  *Colon polyps.  Status post polypectomy today.  Monitor for any bleeding.  DVT prophylaxis.  We will start him on Lovenox 40 mg subcu daily  All the records are reviewed and case discussed with ED provider. Management plans discussed with the patient, family and they are in agreement.  CODE STATUS: Full code  TOTAL CC TIME TAKING CARE OF THIS PATIENT: 40 minutes.   Leia Alf Blakleigh Straw M.D on 01/08/2018 at 1:43 PM  Between 7am to 6pm - Pager - [phone]  After 6pm go to [email or web address] - password [email or web address] [facility]  Office   [phone]  CC: Primary care physician; Cletis Athens, MD  Note: This dictation was prepared with Dragon dictation along with smaller phrase technology. Any transcriptional errors that result from this process are unintentional.

## 2018-01-08 NOTE — Consult Note (Signed)
Cardiology Consult    Patient ID: Luis Blank [facility] MRN: [id number], DOB/AGE: [birth year]   Admit date: 01/08/2018 Date of Consult: 01/08/2018  Primary Physician: Cletis Athens, MD Primary Cardiologist: Kathlyn Sacramento, MD Requesting Provider: Raliegh Ip. Vicente Males, MD  Patient [name] is a 67 y.o. male with a history of HTN, HL, and BPH, who is being seen today for the evaluation of Afib, at the request of Dr. Vicente Males.  Past Medical History   Past Medical History:  Diagnosis Date  . Bladder calculi    a. 10/2017 s/p cystolitholapaxy  . BPH (benign prostatic hyperplasia)    a. 10/2017 s/p TURP.  Marland Kitchen Chronic venous stasis   . DDD (degenerative disc disease), lumbar    L4-5  . History of echocardiogram    a. Reportedly nl.  . History of stress test    a. Reportedly nl.  . Hyperlipidemia   . Hypertension   . Palpitations    a. Has previously worn monitor w/o findings ([place]).  . Polycystic liver disease   . Varicose veins of lower extremity     Past Surgical History:  Procedure Laterality Date  . CYSTOSCOPY WITH LITHOLAPAXY N/A 10/31/2017   Procedure: CYSTOSCOPY WITH LITHOLAPAXY;  Surgeon: Abbie Sons, MD;  Location: [facility] OR[facility];  Service: Urology;  Laterality: N/A;  . FINGER FRACTURE SURGERY    . HERNIA REPAIR    . TONSILLECTOMY    . TRANSURETHRAL RESECTION OF PROSTATE N/A 10/31/2017   Procedure: TRANSURETHRAL RESECTION OF THE PROSTATE (TURP);  Surgeon: Abbie Sons, MD;  Location: [facility] OR[facility];  Service: Urology;  Laterality: N/A;     Allergies  No Known Allergies  History of Present Illness    67 y/o ? w/ a h/o HTN, HL, varicose veins, chronic venous stasis, and BPH w/ bladder calculi s/p TURP and cystolitholapaxy in 10/2017.  He also has a h/o palpitations and wore a monitor at some point in the past, but this did not show anything.  More recently, he occasionally notes his heart racing at night for a few minutes.  No other symptoms.  CT prior to TURP  also suggested an area of narrowing in the rectosigmoid colon.  In that setting, he was set up for colonoscopy which took place earlier today, showing asc colon polyps, non-bleeding internal hemorrhoids, diverticulosis, and a large 78mm lipoma in the transverse colon.  All pathology pending.  Post procedure, he was noted to develop atrial fibrillation with rates in the 90's to 130's.  He is asymptomatic currently.  Home Medications      Prior to Admission medications   Medication Sig Start Date End Date Taking? Authorizing Provider  Flaxseed, Linseed, (FLAXSEED OIL) 1000 MG CAPS Take 1,000 mg daily by mouth.   Yes [provider]  Omega-3 Fatty Acids (FISH OIL) 1000 MG CAPS Take 1,000 mg daily by mouth.   Yes [provider]  omeprazole (PRILOSEC) 20 MG capsule Take 20 mg daily as needed by mouth.   Yes [provider]  OVER THE COUNTER MEDICATION Take 2 capsules daily by mouth. Veggie Basics   Yes [provider]  PUMPKIN SEED PO Take 1,000 mg daily by mouth.   Yes [provider]  Turmeric 400 MG CAPS Take 800 mg daily by mouth.   Yes [provider]  amLODipine (NORVASC) 5 MG tablet Take 5 mg daily by mouth.     [provider]  benazepril (LOTENSIN) 40 MG tablet Take 40  mg daily by mouth.     [provider]  Garlic 993 MG CAPS Take 500 mg daily by mouth.    [provider]  hydrochlorothiazide (MICROZIDE) 12.5 MG capsule Take 12.5 mg daily by mouth.  06/04/17   [provider]  Multiple Vitamin (MULTIVITAMIN) tablet Take 1 tablet by mouth daily.    [provider]  triamcinolone (NASACORT) 55 MCG/ACT AERO nasal inhaler Place 1 spray daily as needed into the nose (congestion).     [provider]     Family History    Family History  Problem Relation Age of Onset  . Stroke Mother        died @ 38  . Congestive Heart Failure Father        died @ 43  . Prostate cancer Neg Hx   .  Bladder Cancer Neg Hx   . Kidney cancer Neg Hx    indicated that his mother is deceased. He indicated that his father is deceased. He indicated that the status of his neg hx is unknown.   Social History    Social History   Socioeconomic History  . Marital status: Married    Spouse name: Not on file  . Number of children: Not on file  . Years of education: Not on file  . Highest education level: Not on file  Social Needs  . Financial resource strain: Not on file  . Food insecurity - worry: Not on file  . Food insecurity - inability: Not on file  . Transportation needs - medical: Not on file  . Transportation needs - non-medical: Not on file  Occupational History  . Occupation: Retired  Tobacco Use  . Smoking status: Former Smoker    Last attempt to quit: 10/27/1989    Years since quitting: 28.2  . Smokeless tobacco: Current User    Types: Chew  Substance and Sexual Activity  . Alcohol use: No  . Drug use: No  . Sexual activity: Not on file  Other Topics Concern  . Not on file  Social History Narrative   Lives locally with wife.  Does not routinely exercise but stays active and still works part-time.     Review of Systems    General:  No chills, fever, night sweats or weight changes.  Cardiovascular:  No chest pain, dyspnea on exertion, edema, orthopnea, palpitations, paroxysmal nocturnal dyspnea. Dermatological: No rash, lesions/masses Respiratory: No cough, dyspnea Urologic: No hematuria, dysuria Abdominal:   No nausea, vomiting, diarrhea, bright red blood per rectum, melena, or hematemesis Neurologic:  No visual changes, wkns, changes in mental status. All other systems reviewed and are otherwise negative except as noted above.  Physical Exam    Blood pressure 128/84, pulse 89, temperature 97.6 F (36.4 C), temperature source Tympanic, resp. rate 18, height 6\' 2"  (1.88 m), weight 260 lb (117.9 kg), SpO2 100 %.  General: Pleasant, NAD Psych: Normal  affect. Neuro: Alert and oriented X 3. Moves all extremities spontaneously. HEENT: Normal  Neck: Supple without bruits or JVD. Lungs:  Resp regular and unlabored, CTA. Heart: IR, IR no s3, s4, or murmurs. Abdomen: Soft, non-tender, non-distended, BS + x 4.  Extremities: No clubbing, cyanosis or edema. DP/PT/Radials 2+ and equal bilaterally.  Labs    CBC, BMET, TSH PENDING   Radiology Studies    No results found.  ECG & Cardiac Imaging    Afib, 102, LAD, poor R progression.  Assessment & Plan    1.  Afib  RVR:  New dx.  He does have a h/o palpitations and wore a monitor several yrs ago, but this was unrevealing.  Rates currently in the 90's to 130's.  CHA2DS2VASc = 2.  Recommend admission for labs and initiation of  blocker therapy along with oral anticoagulation/eliquis 5 BID, once ok'd by GI, as he did have colon polyps resected today.  Following initiation of  blocker and [place], provided that he could be well rate-controlled, we could consider outpt DCCV in 3 wks if necessary.  Will check cbc, bmet, tsh, and echo.  2.  Essential HTN:  BP currently stable.  Resume ccb and acei.  Hold off on HCTZ for now given presumed induced dehydration with bowel prep.  Adding  blocker.  3.  Colon polyps:  Per GI.  Will need direction re: timing of initiation of eliquis.  Signed, Murray Hodgkins, NP 01/08/2018, 12:57 PM  For questions or updates, please contact   Please consult [email or web address] for contact info under Cardiology/STEMI.

## 2018-01-08 NOTE — Op Note (Signed)
[facility] Gastroenterology Patient Name: Luis Patton Procedure Date: 01/08/2018 11:06 AM MRN: [id number] Account #: [id number] Date of Birth: 04/24/1951 Admit Type: Outpatient Age: 67 Room: [facility] ENDO ROOM 4 Gender: Male Note Status: Finalized Procedure:            Colonoscopy Indications:          Screening for colorectal malignant neoplasm Providers:            Jonathon Bellows MD, MD Referring MD:         Cletis Athens, MD (Referring MD) Medicines:            Monitored Anesthesia Care Complications:        No immediate complications. Procedure:            Pre-Anesthesia Assessment:                       - Prior to the procedure, a History and Physical was                        performed, and patient medications, allergies and                        sensitivities were reviewed. The patient's tolerance of                        previous anesthesia was reviewed.                       - The risks and benefits of the procedure and the                        sedation options and risks were discussed with the                        patient. All questions were answered and informed                        consent was obtained.                       - ASA Grade Assessment: III - A patient with severe                        systemic disease.                       After obtaining informed consent, the colonoscope was                        passed under direct vision. Throughout the procedure,                        the patient's blood pressure, pulse, and oxygen                        saturations were monitored continuously. The                        Colonoscope was introduced through the anus and  advanced to the the cecum, identified by the                        appendiceal orifice, IC valve and transillumination.                        The colonoscopy was performed with ease. The patient                        tolerated the procedure well. The  quality of the bowel                        preparation was good. Findings:      The perianal and digital rectal examinations were normal.      A 3 mm polyp was found in the ascending colon. The polyp was sessile.       Resection and retrieval were complete.      A 7 mm polyp was found in the ascending colon. The polyp was sessile.       The polyp was removed with a cold snare. Resection and retrieval were       complete.      Non-bleeding internal hemorrhoids were found during retroflexion. The       hemorrhoids were medium-sized and Grade I (internal hemorrhoids that do       not prolapse).      Multiple small-mouthed diverticula were found in the entire colon.      The exam was otherwise without abnormality on direct and retroflexion       views.      There was a large lipoma, 15 mm in diameter, in the transverse colon. Impression:           - One 3 mm polyp in the ascending colon. Resected and                        retrieved.                       - One 7 mm polyp in the ascending colon, removed with a                        cold snare. Resected and retrieved.                       - Non-bleeding internal hemorrhoids.                       - Diverticulosis in the entire examined colon.                       - The examination was otherwise normal on direct and                        retroflexion views. Recommendation:       - Discharge patient to home (with escort).                       - Resume previous diet.                       - Continue present medications.                       -  Await pathology results.                       - Repeat colonoscopy in 5-10 years for surveillance                        based on pathology results. Procedure Code(s):    --- Professional ---                       [id number], Colonoscopy, flexible; with removal of tumor(s),                        polyp(s), or other lesion(s) by snare technique Diagnosis Code(s):    --- Professional ---                        Z12.11, Encounter for screening for malignant neoplasm                        of colon                       K64.0, First degree hemorrhoids                       D12.2, Benign neoplasm of ascending colon                       K57.30, Diverticulosis of large intestine without                        perforation or abscess without bleeding CPT copyright 2016 American Medical Association. All rights reserved. The codes documented in this report are preliminary and upon coder review may  be revised to meet current compliance requirements. Jonathon Bellows, MD Jonathon Bellows MD, MD 01/08/2018 11:30:33 AM This report has been signed electronically. Number of Addenda: 0 Note Initiated On: 01/08/2018 11:06 AM Scope Withdrawal Time: 0 hours 13 minutes 58 seconds  Total Procedure Duration: 0 hours 16 minutes 0 seconds       [facility]

## 2018-01-08 NOTE — Progress Notes (Signed)
*  PRELIMINARY RESULTS* Echocardiogram 2D Echocardiogram has been performed.  Luis Patton 01/08/2018, 3:28 PM

## 2018-01-09 ENCOUNTER — Encounter: Payer: Self-pay | Admitting: Gastroenterology

## 2018-01-09 LAB — BASIC METABOLIC PANEL
ANION GAP: 8 (ref 5–15)
BUN: 17 mg/dL (ref 6–20)
CO2: 22 mmol/L (ref 22–32)
Calcium: 8.8 mg/dL — ABNORMAL LOW (ref 8.9–10.3)
Chloride: 108 mmol/L (ref 101–111)
Creatinine, Ser: 1.08 mg/dL (ref 0.61–1.24)
GFR calc Af Amer: >60 mL/min (ref >60)
GFR calc non Af Amer: >60 mL/min (ref >60)
GLUCOSE: 99 mg/dL (ref 65–99)
Potassium: 3.9 mmol/L (ref 3.5–5.1)
Sodium: 138 mmol/L (ref 135–145)

## 2018-01-09 LAB — SURGICAL PATHOLOGY

## 2018-01-09 MED ORDER — APIXABAN 5 MG PO TABS
5.0000 mg | ORAL_TABLET | Freq: Two times a day (BID) | ORAL | Status: DC
  Administered 2018-01-09 – 2018-01-10 (×3): 5 mg via ORAL
  Filled 2018-01-09 (×3): qty 1

## 2018-01-09 NOTE — Progress Notes (Addendum)
ANTICOAGULATION CONSULT NOTE - Initial Consult  Pharmacy Consult for apixaban Indication: atrial fibrillation  No Known Allergies  Patient Measurements: Height: 6\' 2"  (188 cm) Weight: 258 lb (117 kg) IBW/kg (Calculated) : 82.2  Vital Signs: Temp: 98.3 F (36.8 C) (01/29 0946) Temp Source: Oral (01/29 0946) BP: 104/63 (01/29 0946) Pulse Rate: 92 (01/29 0946)  Labs: Recent Labs    01/08/18 1311 01/08/18 1807 01/09/18 0447  HGB 15.0  --   --   HCT 44.2  --   --   PLT 233  --   --   CREATININE 1.05  --  1.08  TROPONINI <0.03 <0.03  --     Estimated Creatinine Clearance: 89 mL/min (by C-G formula based on SCr of 1.08 mg/dL).  Assessment: Pharmacy to dose and monitor apixaban in this 67 year old male with new onset atrial fibrillation.   Plan:  Apixaban 5 mg PO BID SCr and CBC to be checked at least every 72 hours while inpatient per protocol.  Lenis Noon, PharmD, BCPS Clinical Pharmacist 01/09/2018,12:49 PM   Addendum: Counseled by pharmacy on 1/29.  Lenis Noon, PharmD

## 2018-01-09 NOTE — Plan of Care (Signed)
  Progressing Education: Knowledge of General Education information will improve 01/09/2018 0231 - Progressing by Marylouise Stacks, RN Health Behavior/Discharge Planning: Ability to manage health-related needs will improve 01/09/2018 0231 - Progressing by Marylouise Stacks, RN Clinical Measurements: Ability to maintain clinical measurements within normal limits will improve 01/09/2018 0231 - Progressing by Marylouise Stacks, RN Will remain free from infection 01/09/2018 0231 - Progressing by Marylouise Stacks, RN Diagnostic test results will improve 01/09/2018 0231 - Progressing by Marylouise Stacks, RN Respiratory complications will improve 01/09/2018 0231 - Progressing by Marylouise Stacks, RN Cardiovascular complication will be avoided 01/09/2018 0231 - Progressing by Marylouise Stacks, RN Activity: Risk for activity intolerance will decrease 01/09/2018 0231 - Progressing by Marylouise Stacks, RN Nutrition: Adequate nutrition will be maintained 01/09/2018 0231 - Progressing by Marylouise Stacks, RN Coping: Level of anxiety will decrease 01/09/2018 0231 - Progressing by Marylouise Stacks, RN Elimination: Will not experience complications related to bowel motility 01/09/2018 0231 - Progressing by Marylouise Stacks, RN Will not experience complications related to urinary retention 01/09/2018 0231 - Progressing by Marylouise Stacks, RN Pain Managment: General experience of comfort will improve 01/09/2018 0231 - Progressing by Marylouise Stacks, RN Safety: Ability to remain free from injury will improve 01/09/2018 0231 - Progressing by Marylouise Stacks, RN Skin Integrity: Risk for impaired skin integrity will decrease 01/09/2018 0231 - Progressing by Marylouise Stacks, RN

## 2018-01-09 NOTE — Plan of Care (Signed)
  Not Progressing Clinical Measurements: Cardiovascular complication will be avoided 01/09/2018 1948 - Not Progressing by Marylouise Stacks, RN  Patient still in Afib. HR up in the 110's today.

## 2018-01-09 NOTE — Progress Notes (Signed)
Progress Note  Patient Name: Luis Patton Date of Encounter: 01/09/2018  Primary Cardiologist: Fletcher Anon  Subjective   Remains in Afib with improved ventricular rates in the 80s bpm. Has ambulated in the room without issues. Asymptomatic. No BRBPR. Echo as below. BP soft, though stable.   Inpatient Medications    Scheduled Meds: . enoxaparin (LOVENOX) injection  40 mg Subcutaneous Q24H  . metoprolol tartrate  25 mg Oral BID  . sodium chloride flush  3 mL Intravenous Q12H   Continuous Infusions:  PRN Meds: acetaminophen **OR** acetaminophen, albuterol, ondansetron **OR** ondansetron (ZOFRAN) IV, polyethylene glycol   Vital Signs    Vitals:   01/08/18 2156 01/09/18 0029 01/09/18 0459 01/09/18 0946  BP: (!) 105/58 (!) 93/56 93/67 104/63  Pulse: 66 70 79 92  Resp:   19 14  Temp:   98 F (36.7 C) 98.3 F (36.8 C)  TempSrc:   Oral Oral  SpO2:   98% 93%  Weight:   258 lb (117 kg)   Height:        Intake/Output Summary (Last 24 hours) at 01/09/2018 1040 Last data filed at 01/09/2018 1020 Gross per 24 hour  Intake 800 ml  Output 1 ml  Net 799 ml   Filed Weights   01/08/18 1002 01/09/18 0459  Weight: 260 lb (117.9 kg) 258 lb (117 kg)    Telemetry    Afib, 80s bpm - Personally Reviewed  ECG    n/a - Personally Reviewed  Physical Exam   GEN: No acute distress.   Neck: No JVD. Cardiac: Irregularly irregular, no murmurs, rubs, or gallops.  Respiratory: Clear to auscultation bilaterally.  GI: Soft, nontender, non-distended.   MS: No edema; No deformity. Neuro:  Alert and oriented x 3; Nonfocal.  Psych: Normal affect.  Labs    Chemistry Recent Labs  Lab 01/08/18 1311 01/09/18 0447  NA 138 138  K 4.3 3.9  CL 103 108  CO2 25 22  GLUCOSE 130* 99  BUN 12 17  CREATININE 1.05 1.08  CALCIUM 8.8* 8.8*  GFRNONAA >60 >60  GFRAA >60 >60  ANIONGAP 10 8     Hematology Recent Labs  Lab 01/08/18 1311  WBC 6.4  RBC 4.88  HGB 15.0  HCT 44.2  MCV  90.6  MCH 30.7  MCHC 33.9  RDW 12.4  PLT 233    Cardiac Enzymes Recent Labs  Lab 01/08/18 1311 01/08/18 1807  TROPONINI <0.03 <0.03   No results for input(s): TROPIPOC in the last 168 hours.   BNPNo results for input(s): BNP, PROBNP in the last 168 hours.   DDimer No results for input(s): DDIMER in the last 168 hours.   Radiology    No results found.  Cardiac Studies   TTE 01/08/2018: Study Conclusions  - Procedure narrative: Transthoracic echocardiography. The study   was technically difficult. - Left ventricle: The cavity size was normal. There was moderate   concentric hypertrophy. Systolic function was normal. The   estimated ejection fraction was in the range of 55% to 60%. Wall   motion was normal; there were no regional wall motion   abnormalities. The study was not technically sufficient to allow   evaluation of LV diastolic dysfunction due to atrial   fibrillation. - Left atrium: The atrium was mildly dilated.  Patient Profile     67 y.o. male with history of HTN, HL, and BPH, who is being seen today for the evaluation of Afib, at the request of  Dr. Vicente Males.  Assessment & Plan    1. New onset Afib with RVR: -Remains in Afib with well controlled ventricular rates in the 80s bpm -Given history of palpitations, cannot rule out paroxysms of Afib -Continue metoprolol for rate control -Given his CHADS2VASc of at least 2 (HTN, age x 1) he will need DOAC -Await clearance from GI prior to starting DOAC -If he remains asymptomatic and ventricular rates remain well controlled would plan for outpatient DCCV after he has been adequately anticoagulated for at least 3-4 weeks -Should his rates be difficult to control with ambulation or if he becomes symptomatic could consider TEE/DCCV -Echo as above -Lytes ok -TSH ok  2. HTN: -BP stable -Lopressor as above  3. Colon polyps: -Per GI  For questions or updates, please contact [place] Please consult  [email or web address] for contact info under Cardiology/STEMI.    Signed, Christell Faith, PA-C [facility] Pager: [phone] 01/09/2018, 10:40 AM

## 2018-01-09 NOTE — Progress Notes (Signed)
[place] at [facility]                                                                                                                                                                                  Patient Demographics   Luis Patton, is a 67 y.o. male, DOB - 08-16-1951, IRS:[id number]  Admit date - 01/08/2018   Admitting Physician Dustin Flock, MD  Outpatient Primary MD for the patient is Cletis Athens, MD   LOS - 1  Subjective: Patient admitted with A. fib with RVR post polypectomy Heart rate is labile    Review of Systems:   CONSTITUTIONAL: No documented fever. No fatigue, weakness. No weight gain, no weight loss.  EYES: No blurry or double vision.  ENT: No tinnitus. No postnasal drip. No redness of the oropharynx.  RESPIRATORY: No cough, no wheeze, no hemoptysis. No dyspnea.  CARDIOVASCULAR: No chest pain. No orthopnea. No palpitations. No syncope.  GASTROINTESTINAL: No nausea, no vomiting or diarrhea. No abdominal pain. No melena or hematochezia.  GENITOURINARY: No dysuria or hematuria.  ENDOCRINE: No polyuria or nocturia. No heat or cold intolerance.  HEMATOLOGY: No anemia. No bruising. No bleeding.  INTEGUMENTARY: No rashes. No lesions.  MUSCULOSKELETAL: No arthritis. No swelling. No gout.  NEUROLOGIC: No numbness, tingling, or ataxia. No seizure-type activity.  PSYCHIATRIC: No anxiety. No insomnia. No ADD.    Vitals:   Vitals:   01/09/18 0029 01/09/18 0459 01/09/18 0946 01/09/18 1500  BP: (!) 93/56 93/67 104/63 98/61  Pulse: 70 79 92 88  Resp:  19 14 16   Temp:  98 F (36.7 C) 98.3 F (36.8 C) 98 F (36.7 C)  TempSrc:  Oral Oral Oral  SpO2:  98% 93% 97%  Weight:  258 lb (117 kg)    Height:        Wt Readings from Last 3 Encounters:  01/09/18 258 lb (117 kg)  11/23/17 260 lb (117.9 kg)  10/31/17 258 lb (117 kg)     Intake/Output Summary (Last 24 hours) at 01/09/2018 1727 Last data filed at 01/09/2018 1020 Gross per 24 hour   Intake 600 ml  Output 1 ml  Net 599 ml    Physical Exam:   GENERAL: Pleasant-appearing in no apparent distress.  HEAD, EYES, EARS, NOSE AND THROAT: Atraumatic, normocephalic. Extraocular muscles are intact. Pupils equal and reactive to light. Sclerae anicteric. No conjunctival injection. No oro-pharyngeal erythema.  NECK: Supple. There is no jugular venous distention. No bruits, no lymphadenopathy, no thyromegaly.  HEART irregularly irregular. No murmurs, no rubs, no clicks.  LUNGS: Clear to auscultation bilaterally. No rales or rhonchi. No wheezes.  ABDOMEN: Soft, flat, nontender, nondistended. Has  good bowel sounds. No hepatosplenomegaly appreciated.  EXTREMITIES: No evidence of any cyanosis, clubbing, or peripheral edema.  +2 pedal and radial pulses bilaterally.  NEUROLOGIC: The patient is alert, awake, and oriented x3 with no focal motor or sensory deficits appreciated bilaterally.  SKIN: Moist and warm with no rashes appreciated.  Psych: Not anxious, depressed LN: No inguinal LN enlargement    Antibiotics   Anti-infectives (From admission, onward)   None      Medications   Scheduled Meds: . apixaban  5 mg Oral BID  . metoprolol tartrate  25 mg Oral BID  . sodium chloride flush  3 mL Intravenous Q12H   Continuous Infusions: PRN Meds:.acetaminophen **OR** acetaminophen, albuterol, ondansetron **OR** ondansetron (ZOFRAN) IV, polyethylene glycol   Data Review:   Micro Results No results found for this or any previous visit (from the past 240 hour(s)).  Radiology Reports No results found.   CBC Recent Labs  Lab 01/08/18 1311  WBC 6.4  HGB 15.0  HCT 44.2  PLT 233  MCV 90.6  MCH 30.7  MCHC 33.9  RDW 12.4    Chemistries  Recent Labs  Lab 01/08/18 1311 01/09/18 0447  NA 138 138  K 4.3 3.9  CL 103 108  CO2 25 22  GLUCOSE 130* 99  BUN 12 17  CREATININE 1.05 1.08  CALCIUM 8.8* 8.8*  MG 2.0  --     ------------------------------------------------------------------------------------------------------------------ estimated creatinine clearance is 89 mL/min (by C-G formula based on SCr of 1.08 mg/dL). ------------------------------------------------------------------------------------------------------------------ No results for input(s): HGBA1C in the last 72 hours. ------------------------------------------------------------------------------------------------------------------ No results for input(s): CHOL, HDL, LDLCALC, TRIG, CHOLHDL, LDLDIRECT in the last 72 hours. ------------------------------------------------------------------------------------------------------------------ Recent Labs    01/08/18 1311  TSH 1.041   ------------------------------------------------------------------------------------------------------------------ No results for input(s): VITAMINB12, FOLATE, FERRITIN, TIBC, IRON, RETICCTPCT in the last 72 hours.  Coagulation profile No results for input(s): INR, PROTIME in the last 168 hours.  No results for input(s): DDIMER in the last 72 hours.  Cardiac Enzymes Recent Labs  Lab 01/08/18 1311 01/08/18 1807  TROPONINI <0.03 <0.03   ------------------------------------------------------------------------------------------------------------------ Invalid input(s): POCBNP    Assessment & Plan  Patient is a 67 year old post polypectomy with A. fib with RVR  *Atrial fibrillation with rapid ventricular rate.   Patient's heart rate is improved continue metoprolol twice daily I discussed with Dr. Vicente Males of GI regarding anticoagulation he states that patient can be treated with full anticoagulation if no contraindication We will start him on Eliquis  *Hypertension.  Started on metoprolol.  Hold amlodipine and benazepril due to borderline low blood pressure  *Colon polyps.  Status post polypectomy today.  Monitor for any bleeding.        Code  Status Orders  (From admission, onward)        Start     Ordered   01/08/18 1339  Full code  Continuous     01/08/18 1341    Code Status History    Date Active Date Inactive Code Status Order ID Comments User Context   This patient has a current code status but no historical code status.           Consults cardiology  DVT Prophylaxis SCDs Lab Results  Component Value Date   PLT 233 01/08/2018     Time Spent in minutes 35 minutes greater than 50% of time spent in care coordination and counseling patient regarding the condition and plan of care.   Dustin Flock M.D on 01/09/2018 at 5:27 PM  Between 7am  to 6pm - Pager - [phone]  After 6pm go to [email or web address] - password [email or web address] [facility]   Office  [phone]

## 2018-01-10 ENCOUNTER — Telehealth: Payer: Self-pay | Admitting: Cardiovascular Disease

## 2018-01-10 MED ORDER — APIXABAN 5 MG PO TABS: 5.0000 mg | ORAL_TABLET | Freq: Two times a day (BID) | ORAL | 3 refills | Status: DC

## 2018-01-10 MED ORDER — ORAL CARE MOUTH RINSE: 15.0000 mL | Freq: Two times a day (BID) | OROMUCOSAL | Status: DC

## 2018-01-10 MED ORDER — METOPROLOL TARTRATE 25 MG PO TABS: 25.0000 mg | ORAL_TABLET | Freq: Two times a day (BID) | ORAL | 2 refills | Status: DC

## 2018-01-10 NOTE — Care Management Important Message (Signed)
Important Message  Patient Details  Name: Luis Patton MRN: [id number] Date of Birth: 03/18/1951   Medicare Important Message Given:  N/A - LOS <3 / Initial given by admissions    Katrina Stack, RN 01/10/2018, 12:17 PM

## 2018-01-10 NOTE — Discharge Summary (Signed)
[facility] [place] at [facility], 67 y.o., DOB 1951-05-31, MRN [id number]. Admission date: 01/08/2018 Discharge Date 01/10/2018 Primary MD Luis Athens, MD Admitting Physician Dustin Flock, MD  Admission Diagnosis  colon abnormalities  Discharge Diagnosis   Active Problems: A. fib with RVR Essential hypertension Colon polyp status post polypectomy Polycystic liver disease Hyperlipidemia DDD Chronic venous stasis [place]  is a 67 y.o. male with a known history of HTN, varicose veins, colon polyps who was here in the endoscopy suite for an elective colon polypectomy had episode of tachycardia.  Went as high as 160s.  New-onset atrial fibrillation.  Patient was admitted for treatment of his atrial fibrillation.  His medications were adjusted and was seen by cardiology.  Who recommended full dose anticoagulation.  He was started on Eliquis.  He will need follow-up with cardiology for possible cardioversion.  Currently he is doing much better and is asymptomatic and stable for discharge.             Consults  cardiology  Significant Tests:  See full reports for all details    No results found.     Today   Subjective:   Obrian Bulson feeling much better no complaints  Objective:   Blood pressure 119/66, pulse 89, temperature 98.1 F (36.7 C), temperature source Oral, resp. rate 18, height 6\' 2"  (1.88 m), weight 258 lb 11.2 oz (117.3 kg), SpO2 98 %.  .  Intake/Output Summary (Last 24 hours) at 01/10/2018 1437 Last data filed at 01/10/2018 1101 Gross per 24 hour  Intake 360 ml  Output 1 ml  Net 359 ml    Exam VITAL SIGNS: Blood pressure 119/66, pulse 89, temperature 98.1 F (36.7 C), temperature source Oral, resp. rate 18, height 6\' 2"  (1.88 m), weight 258 lb 11.2 oz (117.3 kg), SpO2 98 %.  GENERAL:  67 y.o.-year-old patient lying in the bed with no acute distress.  EYES: Pupils equal, round,  reactive to light and accommodation. No scleral icterus. Extraocular muscles intact.  HEENT: Head atraumatic, normocephalic. Oropharynx and nasopharynx clear.  NECK:  Supple, no jugular venous distention. No thyroid enlargement, no tenderness.  LUNGS: Normal breath sounds bilaterally, no wheezing, rales,rhonchi or crepitation. No use of accessory muscles of respiration.  CARDIOVASCULAR: Irregularly irregular. No murmurs, rubs, or gallops.  ABDOMEN: Soft, nontender, nondistended. Bowel sounds present. No organomegaly or mass.  EXTREMITIES: No pedal edema, cyanosis, or clubbing.  NEUROLOGIC: Cranial nerves II through XII are intact. Muscle strength 5/5 in all extremities. Sensation intact. Gait not checked.  PSYCHIATRIC: The patient is alert and oriented x 3.  SKIN: No obvious rash, lesion, or ulcer.   Data Review     CBC w Diff:  Lab Results  Component Value Date   WBC 6.4 01/08/2018   HGB 15.0 01/08/2018   HCT 44.2 01/08/2018   PLT 233 01/08/2018   CMP:  Lab Results  Component Value Date   NA 138 01/09/2018   K 3.9 01/09/2018   CL 108 01/09/2018   CO2 22 01/09/2018   BUN 17 01/09/2018   CREATININE 1.08 01/09/2018  .  Micro Results No results found for this or any previous visit (from the past 240 hour(s)).      Code Status Orders  (From admission, onward)        Start     Ordered   01/08/18 1339  Full code  Continuous     01/08/18 1341  Code Status History    Date Active Date Inactive Code Status Order ID Comments User Context   This patient has a current code status but no historical code status.          Follow-up Information    Luis Athens, MD. Go on 01/16/2018.   Specialty:  Cardiology Why:  Appointment Time: 2:15pm Contact information: [place] [address] [phone]        Wellington Hampshire, MD. Go on 01/17/2018.   Specialty:  Cardiology Why:  Appointment Time: 8:30am with Laurine Blazer Contact information: [place] [address] [phone]           Discharge Medications   Allergies as of 01/10/2018   No Known Allergies     Medication List    STOP taking these medications   amLODipine 5 MG tablet Commonly known as:  NORVASC   benazepril 40 MG tablet Commonly known as:  LOTENSIN   hydrochlorothiazide 12.5 MG capsule Commonly known as:  MICROZIDE     TAKE these medications   apixaban 5 MG Tabs tablet Commonly known as:  ELIQUIS Take 1 tablet (5 mg total) by mouth 2 (two) times daily.   Fish Oil 1000 MG Caps Take 1,000 mg daily by mouth.   Flaxseed Oil 1000 MG Caps Take 1,000 mg daily by mouth.   Garlic 025 MG Caps Take 500 mg daily by mouth.   metoprolol tartrate 25 MG tablet Commonly known as:  LOPRESSOR Take 1 tablet (25 mg total) by mouth 2 (two) times daily.   multivitamin tablet Take 1 tablet by mouth daily.   omeprazole 20 MG capsule Commonly known as:  PRILOSEC Take 20 mg daily as needed by mouth.   OVER THE COUNTER MEDICATION Take 2 capsules daily by mouth. Veggie Basics   PUMPKIN SEED PO Take 1,000 mg daily by mouth.   triamcinolone 55 MCG/ACT Aero nasal inhaler Commonly known as:  NASACORT Place 1 spray daily as needed into the nose (congestion).   Turmeric 400 MG Caps Take 800 mg daily by mouth.          Total Time in preparing paper work, data evaluation and todays exam - 61 minutes  Dustin Flock M.D on 01/10/2018 at 2:37 PM  [facility]   Office  [phone]

## 2018-01-10 NOTE — Progress Notes (Signed)
  Discharged to home with his wife.  Ed re [employer] and side effects.  Provided with coupon for Eliquis.

## 2018-01-10 NOTE — Telephone Encounter (Signed)
TCM....  Patient is being discharged later today   They saw Dr Fletcher Anon in hospital   They are scheduled to see Christell Faith  on 01/17/18   They were seen for Palpitations   They need to be seen within 1w    Please call

## 2018-01-10 NOTE — Care Management (Signed)
Attending provided patient with 30 day trial coupon for Eliquis.  Patient has pharmacy coverage with his medicare plan

## 2018-01-10 NOTE — Plan of Care (Signed)
Understand need for Eliquis and the importance of taking it daily.

## 2018-01-11 ENCOUNTER — Encounter: Payer: Self-pay | Admitting: Gastroenterology

## 2018-01-11 NOTE — Telephone Encounter (Signed)
Patient contacted regarding discharge from [facility] on 01/10/18.   Patient understands to follow up with provider ? On 01/17/18 at 0830 am at [facility].  Patient understands discharge instructions? Yes   Patient understands medications and regiment? Yes  Patient understands to bring all medications to this visit? Yes

## 2018-01-12 DIAGNOSIS — N423 Unspecified dysplasia of prostate: Secondary | ICD-10-CM | POA: Diagnosis not present

## 2018-01-12 DIAGNOSIS — I10 Essential (primary) hypertension: Secondary | ICD-10-CM | POA: Diagnosis not present

## 2018-01-12 DIAGNOSIS — I4891 Unspecified atrial fibrillation: Secondary | ICD-10-CM | POA: Diagnosis not present

## 2018-01-12 DIAGNOSIS — R319 Hematuria, unspecified: Secondary | ICD-10-CM | POA: Diagnosis not present

## 2018-01-16 ENCOUNTER — Encounter: Payer: Self-pay | Admitting: Physician Assistant

## 2018-01-16 NOTE — Progress Notes (Signed)
Cardiology Office Note Date:  01/17/2018  Patient ID:  Luis Patton, [place] Oct 30, 1951, MRN [id number] PCP:  Cletis Athens, MD  Cardiologist:  Dr. Fletcher Anon, MD    Chief Complaint: Hospital follow up  History of Present Illness: Luis Patton is a 67 y.o. male with history of newly diagnosed Afib in 12/2017 on Eliquis, HTN, HLD, chronic venous stasis, DDD, bladder calculi s/p TURP and cystolitholapaxy in 10/2017, and BPH who presents for hospital follow up after recent admission to [facility] for outpatient diagnostic colonoscopy and developed new onset Afib.  Patient with prior history of palpitations, previously wearing a heart monitor at some point in the past that reportedly did not reveal significant arrhythmia. CT prior to TURP suggested an area of narrowing in the rectosigmoid colon.  In that setting, he was set up for colonoscopy which took place 01/08/2018, showing asc colon polyps, non-bleeding internal hemorrhoids, diverticulosis, and a large 12mm lipoma in the transverse colon. Pathology showed tubular adenoma, negative for high-grade dysplasia and malignancy. Post-procedure, he was noted to be in Afib with RVR with heart rates up to the 130s bpm. He was asymptomatic. Labs showed a normal TSH, K+ 4.3-->3.9, SCr 1.05, Mg++ 2.0, negative cardiac enzymes, unremarkable CBC, glucose 130-->106. TTE 01/08/18 showed EF 55-60%, no RWMA, not technically sufficient to allow for LV diastolic function due to Afib, mildly dilated left atrium measuring 45 mm. He was started on Eliquis after clearance from GI given his elevated CHADS2ASc of at least 2 (HTN, age x 1). He was rate controlled with Lopressor 25 mg bid and remained in Afib at discharge.  He comes in doing well today. Remains in Afib with well controlled ventricular rates. Has noted a couple occasions of palpitations at rest that have lasted approximately 30 seconds and self resolving. He did miss a dose of Eliquis on 01/16/2018. This morning, he had  to strain for a BM and noted possible small amount of blood with his stool. Has not noticed any blood prior to today. No dizziness, presyncope or syncope. Blood pressure well controlled at home in the 654Y to 503T systolic. No chest pain. Follow up colonoscopy in 5 years. He has an appointment with vascular regarding varicose veins in 03/2018.    Past Medical History:  Diagnosis Date  . Bladder calculi    a. 10/2017 s/p cystolitholapaxy  . BPH (benign prostatic hyperplasia)    a. 10/2017 s/p TURP.  Marland Kitchen Chronic venous stasis   . DDD (degenerative disc disease), lumbar    L4-5  . History of echocardiogram    a. TTE 1/19: EF 55-60%, no RWMA, not technically sufficient to allow for LV diastolic function due to Afib, mildly dilated left atrium measuring 45 mm  . History of stress test    a. Reportedly nl.  . Hyperlipidemia   . Hypertension   . Palpitations    a. Has previously worn monitor w/o findings ([place]).  . Persistent atrial fibrillation ([place])    a. diagnosed 01/08/18; b. CHADS2VASc => 2 (HTN, age x 1); c. On eliquis  . Polycystic liver disease   . Varicose veins of lower extremity     Past Surgical History:  Procedure Laterality Date  . COLONOSCOPY WITH PROPOFOL N/A 01/08/2018   Procedure: COLONOSCOPY WITH PROPOFOL;  Surgeon: Jonathon Bellows, MD;  Location: [facility] ENDOSCOPY;  Service: Gastroenterology;  Laterality: N/A;  . CYSTOSCOPY WITH LITHOLAPAXY N/A 10/31/2017   Procedure: CYSTOSCOPY WITH LITHOLAPAXY;  Surgeon: Abbie Sons, MD;  Location: [facility] ORS;  Service: Urology;  Laterality: N/A;  . FINGER FRACTURE SURGERY    . HERNIA REPAIR    . TONSILLECTOMY    . TRANSURETHRAL RESECTION OF PROSTATE N/A 10/31/2017   Procedure: TRANSURETHRAL RESECTION OF THE PROSTATE (TURP);  Surgeon: Abbie Sons, MD;  Location: [facility] ORS;  Service: Urology;  Laterality: N/A;    Current Meds  Medication Sig  . apixaban (ELIQUIS) 5 MG TABS tablet Take 1 tablet (5 mg total) by mouth 2 (two) times  daily.  . Flaxseed, Linseed, (FLAXSEED OIL) 1000 MG CAPS Take 1,000 mg daily by mouth.  . Garlic 161 MG CAPS Take 500 mg daily by mouth.  . metoprolol tartrate (LOPRESSOR) 25 MG tablet Take 1 tablet (25 mg total) by mouth 2 (two) times daily.  . Multiple Vitamin (MULTIVITAMIN) tablet Take 1 tablet by mouth daily.  . Omega-3 Fatty Acids (FISH OIL) 1000 MG CAPS Take 1,000 mg daily by mouth.  Marland Kitchen omeprazole (PRILOSEC) 20 MG capsule Take 20 mg daily as needed by mouth.  Marland Kitchen OVER THE COUNTER MEDICATION Take 2 capsules daily by mouth. Veggie Basics  . PUMPKIN SEED PO Take 1,000 mg daily by mouth.  . triamcinolone (NASACORT) 55 MCG/ACT AERO nasal inhaler Place 1 spray daily as needed into the nose (congestion).   . Turmeric 400 MG CAPS Take 800 mg daily by mouth.    Allergies:   Patient has no known allergies.   Social History:  The patient  reports that he quit smoking about 28 years ago. His smokeless tobacco use includes chew. He reports that he does not drink alcohol or use drugs.   Family History:  The patient's family history includes Congestive Heart Failure in his father; Stroke in his mother.  ROS:   Review of Systems  Constitutional: Negative for chills, diaphoresis, fever, malaise/fatigue and weight loss.  HENT: Negative for congestion.   Eyes: Negative for discharge and redness.  Respiratory: Negative for cough, hemoptysis, sputum production, shortness of breath and wheezing.   Cardiovascular: Positive for palpitations. Negative for chest pain, orthopnea, claudication, leg swelling and PND.  Gastrointestinal: Positive for blood in stool. Negative for abdominal pain, heartburn, melena, nausea and vomiting.       Possible BRBPR this morning with straining to have a BM  Genitourinary: Negative for hematuria.  Musculoskeletal: Negative for falls and myalgias.  Skin: Negative for rash.  Neurological: Negative for dizziness, tingling, tremors, sensory change, speech change, focal weakness,  loss of consciousness and weakness.  Endo/Heme/Allergies: Does not bruise/bleed easily.  Psychiatric/Behavioral: Negative for substance abuse. The patient is not nervous/anxious.      PHYSICAL EXAM:  VS:  BP 128/72 (BP Location: Left Arm, Patient Position: Sitting, Cuff Size: Normal)   Pulse 85   Ht 6\' 2"  (1.88 m)   Wt 265 lb (120.2 kg)   BMI 34.02 kg/m  BMI: Body mass index is 34.02 kg/m.  Physical Exam  Constitutional: He is oriented to person, place, and time. He appears well-developed and well-nourished.  HENT:  Head: Normocephalic and atraumatic.  Eyes: Right eye exhibits no discharge. Left eye exhibits no discharge.  Neck: Normal range of motion. No JVD present.  Cardiovascular: Normal rate, S1 normal, S2 normal and normal heart sounds. An irregularly irregular rhythm present. Exam reveals no distant heart sounds, no friction rub, no midsystolic click and no opening snap.  No murmur heard. Pulses:      Posterior tibial pulses are 2+ on the right side, and 2+ on the left side.  Pulmonary/Chest: Effort normal and breath sounds normal. No respiratory distress. He has no decreased breath sounds. He has no wheezes. He has no rales. He exhibits no tenderness.  Abdominal: Soft. He exhibits no distension. There is no tenderness.  Musculoskeletal: He exhibits no edema.  Neurological: He is alert and oriented to person, place, and time.  Skin: Skin is warm and dry. No cyanosis. Nails show no clubbing.  Psychiatric: He has a normal mood and affect. His speech is normal and behavior is normal. Judgment and thought content normal.     EKG:  Was ordered and interpreted by me today. Shows Afib, 85 bpm, left axis deviation, poor R wave progression, nonspecific st/t changes  Recent Labs: 01/08/2018: Hemoglobin 15.0; Magnesium 2.0; Platelets 233; TSH 1.041 01/09/2018: BUN 17; Creatinine, Ser 1.08; Potassium 3.9; Sodium 138  No results found for requested labs within last 8760 hours.    Estimated Creatinine Clearance: 90.2 mL/min (by C-G formula based on SCr of 1.08 mg/dL).   Wt Readings from Last 3 Encounters:  01/17/18 265 lb (120.2 kg)  01/10/18 258 lb 11.2 oz (117.3 kg)  11/23/17 260 lb (117.9 kg)     Other studies reviewed: Additional studies/records reviewed today include: summarized above  ASSESSMENT AND PLAN:  1. Persistent Afib: Remains in Afib today with ventricular rates controlled at 85 bpm. Asymptomatic. Unfortunately, he did miss a dose of Eliquis on 01/16/2018. He will need 3 weeks of uninterrupted, full-dose anticoagulation prior to proceeding with outpatient DCCV. Should his ventricular rates become difficult to control or should he become symptomatic in Afib, could consider TEE/DCCV sooner. Continue Lopressor 25 mg bid for rate control. CHADS2VASc at least 2 (HTN, age x 1). Stop fish oil.   2. Possible BRBPR: Check CBC today. If HGB is stable, would continue on Eliquis. If HGb is low, would need to hold Eliquis and follow up with PCP/GI. He will continue to monitor for possible blood in stool and if this persists into the next 24-48 hours he will contact his PCP for further evaluation. Possibly in the setting of having to strain with BM this morning with known hemorrhoids.   3. HTN: Blood pressure well controlled in the office today. Continue Lopressor as above.   4. Hyperglycemia: Recommend follow up with PCP.   5. Colon polyps: Per GI.   Disposition: F/u with me in 3 weeks to assess if he is still in Afib, if so, would plan to proceed with DCCV (pending compliance with anticoagulation).   Current medicines are reviewed at length with the patient today.  The patient did not have any concerns regarding medicines.  Signed, Christell Faith, PA-C 01/17/2018 8:49 AM     [facility] [address] [place], [place] [address] [phone]

## 2018-01-17 ENCOUNTER — Ambulatory Visit: Payer: Medicare HMO | Admitting: Physician Assistant

## 2018-01-17 ENCOUNTER — Encounter: Payer: Self-pay | Admitting: Physician Assistant

## 2018-01-17 VITALS — BP 128/72 | HR 85 | Ht 74.0 in | Wt 265.0 lb

## 2018-01-17 DIAGNOSIS — I10 Essential (primary) hypertension: Secondary | ICD-10-CM

## 2018-01-17 DIAGNOSIS — K625 Hemorrhage of anus and rectum: Secondary | ICD-10-CM

## 2018-01-17 DIAGNOSIS — I481 Persistent atrial fibrillation: Secondary | ICD-10-CM | POA: Diagnosis not present

## 2018-01-17 DIAGNOSIS — R739 Hyperglycemia, unspecified: Secondary | ICD-10-CM

## 2018-01-17 DIAGNOSIS — K635 Polyp of colon: Secondary | ICD-10-CM

## 2018-01-17 DIAGNOSIS — I4819 Other persistent atrial fibrillation: Secondary | ICD-10-CM

## 2018-01-17 MED ORDER — APIXABAN 5 MG PO TABS: 5.0000 mg | ORAL_TABLET | Freq: Two times a day (BID) | ORAL | 3 refills | Status: DC

## 2018-01-17 MED ORDER — METOPROLOL TARTRATE 25 MG PO TABS: 25.0000 mg | ORAL_TABLET | Freq: Two times a day (BID) | ORAL | 3 refills | Status: AC

## 2018-01-17 NOTE — Patient Instructions (Signed)
Medication Instructions:  Your physician recommends that you continue on your current medications as directed. Please refer to the Current Medication list given to you today.   Labwork: CBC today  Testing/Procedures: none  Follow-Up: Your physician recommends that you schedule a follow-up appointment in: 3 weeks with Christell Faith, PA-C.   Any Other Special Instructions Will Be Listed Below (If Applicable).     If you need a refill on your cardiac medications before your next appointment, please call your pharmacy.

## 2018-01-18 ENCOUNTER — Telehealth: Payer: Self-pay | Admitting: Physician Assistant

## 2018-01-18 LAB — CBC
HEMATOCRIT: 45.3 % (ref 37.5–51.0)
HEMOGLOBIN: 14.9 g/dL (ref 13.0–17.7)
MCH: 30.3 pg (ref 26.6–33.0)
MCHC: 32.9 g/dL (ref 31.5–35.7)
MCV: 92 fL (ref 79–97)
PLATELETS: 270 10*3/uL (ref 150–379)
RBC: 4.92 x10E6/uL (ref 4.14–5.80)
RDW: 13.4 % (ref 12.3–15.4)
WBC: 6.5 10*3/uL (ref 3.4–10.8)

## 2018-01-18 NOTE — Telephone Encounter (Signed)
Patient called back to review labs. Reviewed with patient.  Per Ryan's notes: "Possible BRBPR: Check CBC today. If HGB is stable, would continue on Eliquis. " As Hgb is stable, pt will continue Eliquis as instructed by PA.

## 2018-01-19 ENCOUNTER — Encounter (INDEPENDENT_AMBULATORY_CARE_PROVIDER_SITE_OTHER): Payer: PPO

## 2018-01-19 ENCOUNTER — Ambulatory Visit (INDEPENDENT_AMBULATORY_CARE_PROVIDER_SITE_OTHER): Payer: PPO | Admitting: Vascular Surgery

## 2018-01-22 DIAGNOSIS — M5440 Lumbago with sciatica, unspecified side: Secondary | ICD-10-CM | POA: Diagnosis not present

## 2018-01-22 DIAGNOSIS — M47812 Spondylosis without myelopathy or radiculopathy, cervical region: Secondary | ICD-10-CM | POA: Diagnosis not present

## 2018-01-22 DIAGNOSIS — I10 Essential (primary) hypertension: Secondary | ICD-10-CM | POA: Diagnosis not present

## 2018-01-22 DIAGNOSIS — M5117 Intervertebral disc disorders with radiculopathy, lumbosacral region: Secondary | ICD-10-CM | POA: Diagnosis not present

## 2018-02-05 DIAGNOSIS — R319 Hematuria, unspecified: Secondary | ICD-10-CM | POA: Diagnosis not present

## 2018-02-05 DIAGNOSIS — M47812 Spondylosis without myelopathy or radiculopathy, cervical region: Secondary | ICD-10-CM | POA: Diagnosis not present

## 2018-02-05 DIAGNOSIS — M5117 Intervertebral disc disorders with radiculopathy, lumbosacral region: Secondary | ICD-10-CM | POA: Diagnosis not present

## 2018-02-05 DIAGNOSIS — M5440 Lumbago with sciatica, unspecified side: Secondary | ICD-10-CM | POA: Diagnosis not present

## 2018-02-05 DIAGNOSIS — H9192 Unspecified hearing loss, left ear: Secondary | ICD-10-CM | POA: Diagnosis not present

## 2018-02-08 NOTE — Progress Notes (Signed)
Cardiology Office Note Date:  02/09/2018  Patient ID:  Luis Patton, [place] 1951-05-20, MRN [id number] PCP:  Cletis Athens, MD  Cardiologist:  Dr. Fletcher Anon, MD    Chief Complaint: Follow up  History of Present Illness: Luis Patton is a 67 y.o. male with history of newly diagnosed Afib in 12/2017 on Eliquis, HTN, HLD, chronic venous stasis, DDD, bladder calculi s/p TURP and cystolitholapaxy in 10/2017, and BPH who presents for follow up of Afib.  Patient with prior history of palpitations, previously wearing a heart monitor at some point in the past that reportedly did not reveal significant arrhythmia. CT prior to TURP suggested an area of narrowing in the rectosigmoid colon.  In that setting, he was set up for colonoscopy which took place 01/08/2018, showing ascending colon polyps, non-bleeding internal hemorrhoids, diverticulosis, and a large 29mm lipoma in the transverse colon. Pathology showed tubular adenoma, negative for high-grade dysplasia and malignancy. Post-procedure, he was noted to be in Afib with RVR with heart rates up to the 130s bpm. He was asymptomatic. Labs showed a normal TSH, K+ 4.3-->3.9, SCr 1.05, Mg++ 2.0, negative cardiac enzymes, unremarkable CBC, glucose 130-->106. TTE 01/08/18 showed EF 55-60%, no RWMA, not technically sufficient to allow for LV diastolic function due to Afib, mildly dilated left atrium measuring 45 mm. He was started on Eliquis after clearance from GI given his elevated CHADS2ASc of at least 2 (HTN, age x 1). He was rate controlled with Lopressor 25 mg bid and remained in Afib at discharge. In hospital follow up on 01/17/18, he was doing well. He remained in Afib with well controlled ventricular rates. He did miss a dose of Eliquis on 01/16/18. He noted BRBPR after straining for a BM with CBC following this incident showing a stable HGB.   He comes in doing well today.  He reports several days ago he began to feel significantly better.  He describes this  as resolution of palpitations and exertional shortness of breath.  No chest pain.  Of note, patient is noted to have spontaneously converted on his own since he was last seen.  He is now in sinus rhythm with a heart rate of 60 bpm.  He has been compliant with Eliquis 5 mg twice daily and has not missed any doses since he was last seen.  He has also been compliant with Lopressor 25 mg twice daily.  Weight is down 3 pounds since he was last seen.  No orthopnea, early satiety, or lower extremity swelling.  No further BRBPR.  No melena.  He denies any symptoms consistent with sleep apnea.  Labs from 07/2017: Total cholesterol 165, triglyceride 49, HDL 53, LDL 102   Past Medical History:  Diagnosis Date  . Bladder calculi    a. 10/2017 s/p cystolitholapaxy  . BPH (benign prostatic hyperplasia)    a. 10/2017 s/p TURP.  Marland Kitchen Chronic venous stasis   . DDD (degenerative disc disease), lumbar    L4-5  . History of echocardiogram    a. TTE 1/19: EF 55-60%, no RWMA, not technically sufficient to allow for LV diastolic function due to Afib, mildly dilated left atrium measuring 45 mm  . History of stress test    a. Reportedly nl.  . Hyperlipidemia   . Hypertension   . Palpitations    a. Has previously worn monitor w/o findings ([place]).  . Persistent atrial fibrillation ([place])    a. diagnosed 01/08/18; b. CHADS2VASc => 2 (HTN, age x 1); c. On eliquis  .  Polycystic liver disease   . Varicose veins of lower extremity     Past Surgical History:  Procedure Laterality Date  . COLONOSCOPY WITH PROPOFOL N/A 01/08/2018   Procedure: COLONOSCOPY WITH PROPOFOL;  Surgeon: Jonathon Bellows, MD;  Location: [facility] ENDOSCOPY;  Service: Gastroenterology;  Laterality: N/A;  . CYSTOSCOPY WITH LITHOLAPAXY N/A 10/31/2017   Procedure: CYSTOSCOPY WITH LITHOLAPAXY;  Surgeon: Abbie Sons, MD;  Location: [facility] ORS;  Service: Urology;  Laterality: N/A;  . FINGER FRACTURE SURGERY    . HERNIA REPAIR    . TONSILLECTOMY    .  TRANSURETHRAL RESECTION OF PROSTATE N/A 10/31/2017   Procedure: TRANSURETHRAL RESECTION OF THE PROSTATE (TURP);  Surgeon: Abbie Sons, MD;  Location: [facility] ORS;  Service: Urology;  Laterality: N/A;    Current Meds  Medication Sig  . apixaban (ELIQUIS) 5 MG TABS tablet Take 1 tablet (5 mg total) by mouth 2 (two) times daily.  . Flaxseed, Linseed, (FLAXSEED OIL) 1000 MG CAPS Take 1,000 mg daily by mouth.  . Garlic 175 MG CAPS Take 500 mg daily by mouth.  . metoprolol tartrate (LOPRESSOR) 25 MG tablet Take 1 tablet (25 mg total) by mouth 2 (two) times daily.  . Multiple Vitamin (MULTIVITAMIN) tablet Take 1 tablet by mouth daily.  . Omega-3 Fatty Acids (FISH OIL) 1000 MG CAPS Take 1,000 mg daily by mouth.  Marland Kitchen omeprazole (PRILOSEC) 20 MG capsule Take 20 mg daily as needed by mouth.  Marland Kitchen OVER THE COUNTER MEDICATION Take 2 capsules daily by mouth. Veggie Basics  . PUMPKIN SEED PO Take 1,000 mg daily by mouth.  . triamcinolone (NASACORT) 55 MCG/ACT AERO nasal inhaler Place 1 spray daily as needed into the nose (congestion).   . Turmeric 400 MG CAPS Take 800 mg daily by mouth.    Allergies:   Patient has no known allergies.   Social History:  The patient  reports that he quit smoking about 28 years ago. His smokeless tobacco use includes chew. He reports that he does not drink alcohol or use drugs.   Family History:  The patient's family history includes Congestive Heart Failure in his father; Stroke in his mother.  ROS:   Review of Systems  Constitutional: Negative for chills, diaphoresis, fever, malaise/fatigue and weight loss.  HENT: Negative for congestion.   Eyes: Negative for discharge and redness.  Respiratory: Negative for cough, hemoptysis, sputum production, shortness of breath and wheezing.   Cardiovascular: Negative for chest pain, palpitations, orthopnea, claudication, leg swelling and PND.  Gastrointestinal: Negative for abdominal pain, blood in stool, heartburn, melena, nausea  and vomiting.  Genitourinary: Negative for hematuria.  Musculoskeletal: Negative for falls and myalgias.  Skin: Negative for rash.  Neurological: Negative for dizziness, tingling, tremors, sensory change, speech change, focal weakness, loss of consciousness and weakness.  Endo/Heme/Allergies: Does not bruise/bleed easily.  Psychiatric/Behavioral: Negative for substance abuse. The patient is not nervous/anxious.   All other systems reviewed and are negative.    PHYSICAL EXAM:  VS:  BP 122/74 (BP Location: Left Arm, Patient Position: Sitting, Cuff Size: Large)   Pulse (!) 55   Ht 6\' 3"  (1.905 m)   Wt 262 lb 4 oz (119 kg)   BMI 32.78 kg/m  BMI: Body mass index is 32.78 kg/m.  Physical Exam  Constitutional: He is oriented to person, place, and time. He appears well-developed and well-nourished.  HENT:  Head: Normocephalic and atraumatic.  Eyes: Right eye exhibits no discharge. Left eye exhibits no discharge.  Neck: Normal range  of motion. No JVD present.  Cardiovascular: Normal rate, regular rhythm, S1 normal, S2 normal and normal heart sounds. Exam reveals no distant heart sounds, no friction rub, no midsystolic click and no opening snap.  No murmur heard. Pulses:      Posterior tibial pulses are 2+ on the right side, and 2+ on the left side.  Pulmonary/Chest: Effort normal and breath sounds normal. No respiratory distress. He has no decreased breath sounds. He has no wheezes. He has no rales. He exhibits no tenderness.  Abdominal: Soft. He exhibits no distension. There is no tenderness.  Musculoskeletal: He exhibits no edema.  Neurological: He is alert and oriented to person, place, and time.  Skin: Skin is warm and dry. No cyanosis. Nails show no clubbing.  Psychiatric: He has a normal mood and affect. His speech is normal and behavior is normal. Judgment and thought content normal.     EKG:  Was ordered and interpreted by me today. Shows NSR, 60 bpm, left axis deviation,  nonspecific st/t changes  Recent Labs: 01/08/2018: Magnesium 2.0; TSH 1.041 01/09/2018: BUN 17; Creatinine, Ser 1.08; Potassium 3.9; Sodium 138 01/17/2018: Hemoglobin 14.9; Platelets 270  No results found for requested labs within last 8760 hours.   CrCl cannot be calculated (Patient's most recent lab result is older than the maximum 21 days allowed.).   Wt Readings from Last 3 Encounters:  02/09/18 262 lb 4 oz (119 kg)  01/17/18 265 lb (120.2 kg)  01/10/18 258 lb 11.2 oz (117.3 kg)     Other studies reviewed: Additional studies/records reviewed today include: summarized above  ASSESSMENT AND PLAN:  1. Persistent A. Fib: He has spontaneously converted to sinus rhythm since he was seen on 01/17/18.  If feels like this happened a couple of days prior.  He feels significantly better and is without palpitations or exertional shortness of breath.  He has been compliant with Eliquis 5 mg twice daily and Lopressor 25 mg daily without missing any doses.  No further BRBPR.  Given that he has spontaneously converted to sinus rhythm there is no indication for electrical cardioversion at this time.  We will continue Lopressor 25 mg twice daily for rate control Eliquis 5 mg twice daily given his CHADS2VASc of 2 (HTN, age x 1).  Check BMP to evaluate potassium.  This episode of A. fib was likely in the setting of possible electrolyte depletion in the setting of colonoscopy prep.  2. Essential hypertension: Blood pressure is well controlled today.  Continue Lopressor as above.  3. Hyperglycemia: Per PCP.  4. Hyperlipidemia: LDL in 07/2017 of 103 as above.  Recommend dietary changes with lifestyle modification.  Plan for follow-up lipid panel in approximately 2-3 months.  5. Colon polyps: Per GI.  Disposition: F/u with Dr. Fletcher Anon in 3 months.  Current medicines are reviewed at length with the patient today.  The patient did not have any concerns regarding medicines.  Signed, Christell Faith, PA-C 02/09/2018  9:37 AM     [facility] [address] [place] [address] [place], [place] [address] [phone]

## 2018-02-09 ENCOUNTER — Ambulatory Visit: Payer: Medicare HMO | Admitting: Physician Assistant

## 2018-02-09 ENCOUNTER — Encounter: Payer: Self-pay | Admitting: Physician Assistant

## 2018-02-09 VITALS — BP 122/74 | HR 55 | Ht 75.0 in | Wt 262.2 lb

## 2018-02-09 DIAGNOSIS — R739 Hyperglycemia, unspecified: Secondary | ICD-10-CM | POA: Diagnosis not present

## 2018-02-09 DIAGNOSIS — K635 Polyp of colon: Secondary | ICD-10-CM | POA: Diagnosis not present

## 2018-02-09 DIAGNOSIS — I480 Paroxysmal atrial fibrillation: Secondary | ICD-10-CM

## 2018-02-09 DIAGNOSIS — E785 Hyperlipidemia, unspecified: Secondary | ICD-10-CM | POA: Diagnosis not present

## 2018-02-09 DIAGNOSIS — I10 Essential (primary) hypertension: Secondary | ICD-10-CM

## 2018-02-09 DIAGNOSIS — I481 Persistent atrial fibrillation: Secondary | ICD-10-CM | POA: Diagnosis not present

## 2018-02-09 DIAGNOSIS — I4819 Other persistent atrial fibrillation: Secondary | ICD-10-CM

## 2018-02-09 NOTE — Patient Instructions (Signed)
Medication Instructions:  Your physician recommends that you continue on your current medications as directed. Please refer to the Current Medication list given to you today.   Labwork: BMET today  Testing/Procedures: none  Follow-Up: Your physician recommends that you schedule a follow-up appointment in: 3 months with Dr. Arida.    Any Other Special Instructions Will Be Listed Below (If Applicable).     If you need a refill on your cardiac medications before your next appointment, please call your pharmacy.   

## 2018-02-10 LAB — BASIC METABOLIC PANEL
BUN / CREAT RATIO: 18 (ref 10–24)
BUN: 18 mg/dL (ref 8–27)
CO2: 21 mmol/L (ref 20–29)
Calcium: 9 mg/dL (ref 8.6–10.2)
Chloride: 104 mmol/L (ref 96–106)
Creatinine, Ser: 1 mg/dL (ref 0.76–1.27)
GFR calc Af Amer: 89 mL/min/{1.73_m2} (ref >59)
GFR calc non Af Amer: 77 mL/min/{1.73_m2} (ref >59)
Glucose: 102 mg/dL — ABNORMAL HIGH (ref 65–99)
POTASSIUM: 4.2 mmol/L (ref 3.5–5.2)
SODIUM: 139 mmol/L (ref 134–144)

## 2018-02-23 ENCOUNTER — Ambulatory Visit: Payer: PPO | Admitting: Urology

## 2018-03-16 ENCOUNTER — Encounter (INDEPENDENT_AMBULATORY_CARE_PROVIDER_SITE_OTHER): Payer: Self-pay | Admitting: Vascular Surgery

## 2018-03-16 ENCOUNTER — Ambulatory Visit (INDEPENDENT_AMBULATORY_CARE_PROVIDER_SITE_OTHER): Payer: Medicare HMO | Admitting: Vascular Surgery

## 2018-03-16 ENCOUNTER — Ambulatory Visit (INDEPENDENT_AMBULATORY_CARE_PROVIDER_SITE_OTHER): Payer: Medicare HMO

## 2018-03-16 VITALS — BP 128/81 | HR 56 | Resp 15 | Ht 75.0 in | Wt 259.0 lb

## 2018-03-16 DIAGNOSIS — R600 Localized edema: Secondary | ICD-10-CM | POA: Diagnosis not present

## 2018-03-16 DIAGNOSIS — I83813 Varicose veins of bilateral lower extremities with pain: Secondary | ICD-10-CM | POA: Diagnosis not present

## 2018-03-16 DIAGNOSIS — I10 Essential (primary) hypertension: Secondary | ICD-10-CM | POA: Diagnosis not present

## 2018-03-16 NOTE — Patient Instructions (Signed)
Nonsurgical Procedures for Varicose Veins, Care After This sheet gives you information about how to care for yourself after your procedure. Your health care provider may also give you more specific instructions. If you have problems or questions, contact your health care provider. What can I expect after the procedure? After the procedure, it is common to have:  Swelling.  Bruising.  Soreness.  Mild skin discoloration.  Slight bleeding at the incision sites.  Follow these instructions at home: Incision or puncture site care  Follow instructions from your health care provider about how to take care of your incision or puncture site. Make sure you: ? Wash your hands with soap and water before you change your bandage (dressing). If soap and water are not available, use hand sanitizer. ? Change your dressing as told by your health care provider. ? Leave skin glue or adhesive strips in place. These skin closures may need to stay in place for 2 weeks or longer. If adhesive strip edges start to loosen and curl up, you may trim the loose edges. Do not remove adhesive strips completely unless your health care provider tells you to do that.  Check your incision or puncture area every day for signs of infection. Check for: ? Redness, swelling, or pain. ? Fluid or blood. ? Warmth. ? Pus or a bad smell. General instructions  Take over-the-counter and prescription medicines only as told by your health care provider.  Wear compression stockings as told by your health care provider. These stockings help to prevent blood clots and reduce swelling in your legs.  Do not take baths, swim, or use a hot tub until your health care provider approves. Ask your health care provider if you can take showers.  Wear loose-fitting clothing.  Return to your normal activities as told by your health care provider. Ask your health care provider what activities are safe for you.  Get regular daily exercise. Walk  or ride a stationary bike daily or as told by your health care provider.  Keep all follow-up visits as told by your health care provider. This is important. Contact a health care provider if:  You have a fever.  You have redness, swelling, or pain around your incision or puncture site.  You have fluid or blood coming from your incision or puncture site.  Your incision or puncture site feels warm to the touch.  You have pus or a bad smell coming from your incision or puncture site.  You develop a cough. Get help right away if:  You pass out.  You have very bad pain in your leg.  You have leg pain that gets worse when you walk.  You have redness or swelling in your leg that is getting worse.  You have trouble breathing.  You cough up blood. Summary  After the procedure, it is common to have swelling, bruising, soreness, or mild skin discoloration.  Follow instructions from your health care provider about how to take care of your incision or puncture site.  Wear compression stockings as told by your health care provider. These stockings help to prevent blood clots and reduce swelling in your legs. This information is not intended to replace advice given to you by your health care provider. Make sure you discuss any questions you have with your health care provider. Document Released: 03/10/2017 Document Revised: 03/10/2017 Document Reviewed: 03/10/2017 Elsevier Interactive Patient Education  2018 Elsevier Inc.  

## 2018-03-16 NOTE — Assessment & Plan Note (Signed)
Likely from venous insufficiency

## 2018-03-16 NOTE — Progress Notes (Addendum)
Patient ID: Luis Patton, male   DOB: 07/16/1951, 67 y.o.   MRN: [id number]  Chief Complaint  Patient presents with  . Follow-up    3 month Reflux study    HPI Luis Patton is a 67 y.o. male.  Patient returns in follow up of their venous disease.  They have done their best to comply with the prescribed conservative therapies of compression stockings, leg elevation, exercise, and still requires anti-inflammatories for discomfort and has symptoms that are persistent and bothersome on a daily basis, affecting their activities of daily living and normal activities.  He describes his feet as tingly at night and the swelling is prominent pretty much every day.  The venous reflux study demonstrates bilateral great saphenous vein reflux a little bit longer segment on the right than the left, but most of the leg bilaterally.    Past Medical History:  Diagnosis Date  . A-fib ([place])   . Bladder calculi    a. 10/2017 s/p cystolitholapaxy  . BPH (benign prostatic hyperplasia)    a. 10/2017 s/p TURP.  Marland Kitchen Chronic venous stasis   . DDD (degenerative disc disease), lumbar    L4-5  . History of echocardiogram    a. TTE 1/19: EF 55-60%, no RWMA, not technically sufficient to allow for LV diastolic function due to Afib, mildly dilated left atrium measuring 45 mm  . History of stress test    a. Reportedly nl.  . Hyperlipidemia   . Hypertension   . Palpitations    a. Has previously worn monitor w/o findings ([place]).  . Persistent atrial fibrillation ([place])    a. diagnosed 01/08/18; b. CHADS2VASc => 2 (HTN, age x 1); c. On eliquis  . Polycystic liver disease   . Varicose veins of lower extremity     Past Surgical History:  Procedure Laterality Date  . COLON SURGERY    . COLONOSCOPY WITH PROPOFOL N/A 01/08/2018   Procedure: COLONOSCOPY WITH PROPOFOL;  Surgeon: Jonathon Bellows, MD;  Location: [facility] ENDOSCOPY;  Service: Gastroenterology;  Laterality: N/A;  . CYSTOSCOPY WITH LITHOLAPAXY N/A  10/31/2017   Procedure: CYSTOSCOPY WITH LITHOLAPAXY;  Surgeon: Abbie Sons, MD;  Location: [facility] ORS;  Service: Urology;  Laterality: N/A;  . FINGER FRACTURE SURGERY    . HERNIA REPAIR    . PROSTATE SURGERY    . TONSILLECTOMY    . TRANSURETHRAL RESECTION OF PROSTATE N/A 10/31/2017   Procedure: TRANSURETHRAL RESECTION OF THE PROSTATE (TURP);  Surgeon: Abbie Sons, MD;  Location: [facility] ORS;  Service: Urology;  Laterality: N/A;    Family History  Problem Relation Age of Onset  . Stroke Mother        died @ 23  . Congestive Heart Failure Father        died @ 63  . Prostate cancer Neg Hx   . Bladder Cancer Neg Hx   . Kidney cancer Neg Hx      Social History Social History   Tobacco Use  . Smoking status: Former Smoker    Last attempt to quit: 10/27/1989    Years since quitting: 28.4  . Smokeless tobacco: Current User    Types: Chew  Substance Use Topics  . Alcohol use: No  . Drug use: No     No Known Allergies  Current Outpatient Medications  Medication Sig Dispense Refill  . apixaban (ELIQUIS) 5 MG TABS tablet Take 1 tablet (5 mg total) by mouth 2 (two) times daily. 180 tablet 3  .  benazepril (LOTENSIN) 20 MG tablet Take 20 mg by mouth daily.    . hydrochlorothiazide (MICROZIDE) 12.5 MG capsule     . metoprolol tartrate (LOPRESSOR) 25 MG tablet Take 1 tablet (25 mg total) by mouth 2 (two) times daily. 180 tablet 3  . Multiple Vitamin (MULTIVITAMIN) tablet Take 1 tablet by mouth daily.    Marland Kitchen omeprazole (PRILOSEC) 20 MG capsule Take 20 mg daily as needed by mouth.    . triamcinolone (NASACORT) 55 MCG/ACT AERO nasal inhaler Place 1 spray daily as needed into the nose (congestion).     . Flaxseed, Linseed, (FLAXSEED OIL) 1000 MG CAPS Take 1,000 mg daily by mouth.    . Garlic 510 MG CAPS Take 500 mg daily by mouth.    . Omega-3 Fatty Acids (FISH OIL) 1000 MG CAPS Take 1,000 mg daily by mouth.    Marland Kitchen OVER THE COUNTER MEDICATION Take 2 capsules daily by mouth. Veggie  Basics    . PUMPKIN SEED PO Take 1,000 mg daily by mouth.    . Turmeric 400 MG CAPS Take 800 mg daily by mouth.     No current facility-administered medications for this visit.       REVIEW OF SYSTEMS (Negative unless checked)  Constitutional: _0 Weight loss  _1 Fever  _2 Chills Cardiac: _3 Chest pain   _4 Chest pressure   _5 Palpitations   _6 Shortness of breath when laying flat   _7 Shortness of breath at rest   _8 Shortness of breath with exertion. Vascular:  _9 Pain in legs with walking   _10 Pain in legs at rest   _11 Pain in legs when laying flat   _12 Claudication   _13 Pain in feet when walking  _14 Pain in feet at rest  _15 Pain in feet when laying flat   _16 History of DVT   _17 Phlebitis   _18 Swelling in legs   _19 Varicose veins   _20 Non-healing ulcers Pulmonary:   _21 Uses home oxygen   _22 Productive cough   _23 Hemoptysis   _24 Wheeze  _25 COPD   _26 Asthma Neurologic:  _27 Dizziness  _28 Blackouts   _29 Seizures   _30 History of stroke   _31 History of TIA  _32 Aphasia   _33 Temporary blindness   _34 Dysphagia   _35 Weakness or numbness in arms   _36 Weakness or numbness in legs Musculoskeletal:  _37 Arthritis   _38 Joint swelling   _39 Joint pain   _40 Low back pain Hematologic:  _41 Easy bruising  _42 Easy bleeding   _43 Hypercoagulable state   _44 Anemic  _45 Hepatitis Gastrointestinal:  _46 Blood in stool   _47 Vomiting blood  _48 Gastroesophageal reflux/heartburn   _49 Abdominal pain Genitourinary:  _50 Chronic kidney disease   _51 Difficult urination  _52 Frequent urination  _53 Burning with urination   _54 Hematuria Skin:  _55 Rashes   _56 Ulcers   _57 Wounds Psychological:  _58 History of anxiety   _59  History of major depression.    Physical Exam BP 128/81 (BP Location: Right Arm, Patient Position: Sitting)   Pulse (!) 56   Resp 15   Ht _60  (1.905 m)   Wt 117.5 kg (259 lb)   BMI 32.37 kg/m  Gen:  WD/WN, NAD Head: [place]/AT, No temporalis wasting.  Ear/Nose/Throat: Hearing grossly intact, oropharynx clear Eyes: Sclera non-icteric. Conjunctiva clear Neck:  Supple. Trachea midline Pulmonary:  Good air movement, no use of accessory muscles, respirations not labored.  Cardiac: RRR, No JVD Vascular: Varicosities diffuse and measuring up to 2-3 mm in the right lower extremity        Varicosities diffuse and measuring up to 2 mm in the left lower extremity Vessel Right Left  Radial Palpable Palpable  PT Palpable Palpable  DP Palpable Palpable    Musculoskeletal: M/S 5/5 throughout.   1-2 + RLE edema.  1 + LLE edema.  Moderate stasis dermatitis present on the right, mild stasis dermatitis on the left Neurologic: Sensation grossly intact in extremities.  Symmetrical.  Speech is fluent.  Psychiatric: Judgment intact, Mood & affect appropriate for pt's clinical situation. Dermatologic: No rashes or ulcers noted.  No cellulitis or open wounds.  Radiology No results found.  Labs Recent Results (from the past 2160 hour(s))  Surgical pathology     Status: None   Collection Time: 01/08/18 11:16 AM  Result Value Ref Range   SURGICAL PATHOLOGY      Surgical Pathology CASE: [id number] PATIENT: Obbie Schmuck Surgical Pathology Report     SPECIMEN SUBMITTED: A. Colon polyp, ascending; cbx B. Colon polyp, ascending; cold snare  CLINICAL HISTORY: None provided  PRE-OPERATIVE DIAGNOSIS: Colon abnormalities  POST-OPERATIVE DIAGNOSIS: Diverticulosis, colon polyps, internal hemorrhoids     DIAGNOSIS: A. COLON POLYP, ASCENDING; COLD BIOPSY: - COLONIC MUCOSA WITH FOCAL LYMPHOID AGGREGATE. - NEGATIVE FOR DYSPLASIA AND MALIGNANCY.  B. COLON POLYP, ASCENDING; COLD SNARE: - TUBULAR ADENOMA. - NEGATIVE FOR HIGH GRADE DYSPLASIA AND MALIGNANCY.   GROSS DESCRIPTION:  A. Labeled: C BX polyp ascending colon  Tissue fragment(s): 1  Size: 0.5 cm  Description: in formalin, tan fragment  Entirely submitted in 1 cassette(s).  B. Labeled: cold snare polyp ascending colon  Tissue fragment(s): multiple  Size:  aggregate, 0.6 x 0.3 x 0.1 cm  Description: in formalin, pink-tan fragments and fecal material  E ntirely submitted in 1 cassette(s).        Final Diagnosis performed by Quay Burow, MD.  Electronically signed 01/09/2018 1:21:45PM    The electronic signature indicates that the named Attending Pathologist has evaluated the specimen  Technical component performed at [facility], [address], [place], [place] [address] Lab: [phone] Dir: Rush Farmer, MD, MMM  Professional component performed at [facility], [facility], [place], [place], [place] [address] Lab: [phone] Dir: Dellia Nims. Rubinas, MD    Basic metabolic panel     Status: Abnormal   Collection Time: 01/08/18  1:11 PM  Result Value Ref Range   Sodium 138 135 - 145 mmol/L   Potassium 4.3 3.5 - 5.1 mmol/L   Chloride 103 101 - 111 mmol/L   CO2 25 22 - 32 mmol/L   Glucose, Bld 130 (H) 65 - 99 mg/dL   BUN 12 6 - 20 mg/dL   Creatinine, Ser 1.05 0.61 - 1.24 mg/dL   Calcium 8.8 (L) 8.9 - 10.3 mg/dL   GFR calc non Af Amer >60 >60 mL/min   GFR calc Af Amer >60 >60 mL/min    Comment: (NOTE) The eGFR has been calculated using the CKD EPI equation. This calculation has not been validated in all clinical situations. eGFR's persistently <60 mL/min signify possible Chronic Kidney Disease.    Anion gap 10 5 - 15    Comment: Performed at [facility], [address] [place],  [address]  CBC     Status: None   Collection Time: 01/08/18  1:11 PM  Result Value Ref Range   WBC 6.4 3.8 - 10.6 K/uL   RBC 4.88 4.40 - 5.90 MIL/uL   Hemoglobin 15.0 13.0 - 18.0 g/dL   HCT 44.2 40.0 - 52.0 %   MCV 90.6 80.0 - 100.0 fL   MCH 30.7 26.0 - 34.0 pg   MCHC 33.9 32.0 -  36.0 g/dL   RDW 12.4 11.5 - 14.5 %   Platelets 233 150 - 440 K/uL    Comment: Performed at [facility], [address] [place], [place] [address]  TSH     Status: None   Collection Time: 01/08/18  1:11 PM    Result Value Ref Range   TSH 1.041 0.350 - 4.500 uIU/mL    Comment: Performed by a 3rd Generation assay with a functional sensitivity of <=0.01 uIU/mL. Performed at [facility], [address] [place], [place] [address]   Magnesium     Status: None   Collection Time: 01/08/18  1:11 PM  Result Value Ref Range   Magnesium 2.0 1.7 - 2.4 mg/dL    Comment: Performed at [facility], [address] [place], [place] [address]  Troponin I (q 6hr x 3)     Status: None   Collection Time: 01/08/18  1:11 PM  Result Value Ref Range   Troponin I <0.03 <0.03 ng/mL    Comment: Performed at [facility], [address] [place], [place] [address]  ECHOCARDIOGRAM COMPLETE     Status: None   Collection Time: 01/08/18  3:28 PM  Result Value Ref Range   Weight 4,160 oz   Height 74 in   BP 111/58 mmHg  Troponin I (q 6hr x 3)     Status: None   Collection Time: 01/08/18  6:07 PM  Result Value Ref Range   Troponin I <0.03 <0.03 ng/mL    Comment: Performed at [facility], [address] [place], [place] [address]  Basic metabolic panel     Status: Abnormal   Collection Time: 01/09/18  4:47 AM  Result Value Ref Range   Sodium 138 135 - 145 mmol/L   Potassium 3.9 3.5 - 5.1 mmol/L   Chloride 108 101 - 111 mmol/L   CO2 22 22 - 32 mmol/L   Glucose, Bld 99 65 - 99 mg/dL   BUN 17 6 - 20 mg/dL   Creatinine, Ser 1.08 0.61 - 1.24 mg/dL   Calcium 8.8 (L) 8.9 - 10.3 mg/dL   GFR calc non Af Amer >60 >60 mL/min   GFR calc Af Amer >60 >60 mL/min    Comment: (NOTE) The eGFR has been calculated using the CKD EPI equation. This calculation has not been validated in all clinical situations. eGFR's persistently <60 mL/min signify possible Chronic Kidney Disease.    Anion gap 8 5 - 15    Comment: Performed at [facility], [address] [place],  [address]  CBC     Status: None   Collection Time: 01/17/18  9:29 AM  Result Value Ref Range   WBC 6.5  3.4 - 10.8 x10E3/uL   RBC 4.92 4.14 - 5.80 x10E6/uL   Hemoglobin 14.9 13.0 - 17.7 g/dL   Hematocrit 45.3 37.5 - 51.0 %   MCV 92 79 - 97 fL   MCH 30.3 26.6 - 33.0 pg   MCHC 32.9 31.5 - 35.7 g/dL   RDW 13.4 12.3 - 15.4 %   Platelets 270 150 - 379 [id number]/QV  Basic metabolic panel     Status: Abnormal   Collection Time: 02/09/18  9:53 AM  Result Value Ref Range   Glucose 102 (H) 65 - 99 mg/dL   BUN 18 8 - 27 mg/dL   Creatinine, Ser 1.00 0.76 - 1.27 mg/dL   GFR calc non Af Amer 77 >59 mL/min/1.73   GFR calc  Af Amer 89 >59 mL/min/1.73   BUN/Creatinine Ratio 18 10 - 24   Sodium 139 134 - 144 mmol/L   Potassium 4.2 3.5 - 5.2 mmol/L   Chloride 104 96 - 106 mmol/L   CO2 21 20 - 29 mmol/L   Calcium 9.0 8.6 - 10.2 mg/dL    Assessment/Plan:  Essential hypertension blood pressure control important in reducing the progression of atherosclerotic disease. On appropriate oral medications.   Bilateral lower extremity edema Likely from venous insufficiency  Varicose veins of both lower extremities with pain   The patient has done their best to comply with conservative therapy of 20-30 mm Hg compression stockings, leg elevation, exercise, and anti-inflammatories as needed for discomfort.  Despite this, they continue to have daily and persistent symptoms from their venous disease.  A venous reflux study demonstrates bilateral great saphenous vein reflux a little bit longer segment on the right than the left, but most of the leg bilaterally.  As such, the patient is likely to benefit from endovenous laser ablation of the great saphenous veins bilaterally.  The right leg bothers him a little more and the segment is longer on that side so we could probably treat it first.  Risks and benefits of the procedure including bleeding, infection, recanalization, DVT, and need for further therapy for residual varicosities were discussed.  The patient voices their understanding and is agreeable to proceed with  bilateral staged great saphenous vein laser ablation.     Leotis Pain 03/16/2018, 4:56 PM

## 2018-03-16 NOTE — Assessment & Plan Note (Signed)
blood pressure control important in reducing the progression of atherosclerotic disease. On appropriate oral medications.  

## 2018-03-19 DIAGNOSIS — M47812 Spondylosis without myelopathy or radiculopathy, cervical region: Secondary | ICD-10-CM | POA: Diagnosis not present

## 2018-03-19 DIAGNOSIS — M5440 Lumbago with sciatica, unspecified side: Secondary | ICD-10-CM | POA: Diagnosis not present

## 2018-03-19 DIAGNOSIS — M25312 Other instability, left shoulder: Secondary | ICD-10-CM | POA: Diagnosis not present

## 2018-03-19 DIAGNOSIS — I4891 Unspecified atrial fibrillation: Secondary | ICD-10-CM | POA: Diagnosis not present

## 2018-04-27 DIAGNOSIS — I4891 Unspecified atrial fibrillation: Secondary | ICD-10-CM | POA: Diagnosis not present

## 2018-04-27 DIAGNOSIS — M7581 Other shoulder lesions, right shoulder: Secondary | ICD-10-CM | POA: Diagnosis not present

## 2018-04-27 DIAGNOSIS — M47812 Spondylosis without myelopathy or radiculopathy, cervical region: Secondary | ICD-10-CM | POA: Diagnosis not present

## 2018-04-27 DIAGNOSIS — M25312 Other instability, left shoulder: Secondary | ICD-10-CM | POA: Diagnosis not present

## 2018-05-08 ENCOUNTER — Telehealth: Payer: Self-pay | Admitting: Cardiovascular Disease

## 2018-05-08 ENCOUNTER — Other Ambulatory Visit: Payer: Self-pay | Admitting: *Deleted

## 2018-05-08 NOTE — Telephone Encounter (Signed)
error 

## 2018-05-08 NOTE — Telephone Encounter (Signed)
Patient calling in regards to Eliquis medication  Was told he may be taken off of medication  Patient will be out in 2 days (5/31) but will be seeing Dr. Fletcher Anon on 6/4 Please call to discuss whether or not he will need to continue taking

## 2018-05-08 NOTE — Telephone Encounter (Signed)
Called patient. He remembers being told he may get to come off Eliquis at his next appointment. His appointment is on 05/15/18. Patient agreeable for me to leave samples for about 1 week to get him until the appointment to see if he will continue it after that. He will come by and pick up soon as he has 2 days of medicine left.  Medication Samples have been provided to the patient.  Drug name: Eliquis       Strength: 5 mg         Qty: 1 box  LOT: [id number]  Exp.Date: march 2021

## 2018-05-15 ENCOUNTER — Telehealth: Payer: Self-pay | Admitting: Cardiovascular Disease

## 2018-05-15 ENCOUNTER — Ambulatory Visit: Payer: Medicare HMO | Admitting: Cardiovascular Disease

## 2018-05-15 MED ORDER — APIXABAN 5 MG PO TABS: 5.0000 mg | ORAL_TABLET | Freq: Two times a day (BID) | ORAL | 6 refills | Status: AC

## 2018-05-15 NOTE — Telephone Encounter (Signed)
Given CHADS2VASc at least 2 we would recommend continuation of Eliquis.  Please refill Eliquis 5 mg twice daily.  Recent labs stable.  Follow-up as planned.

## 2018-05-15 NOTE — Telephone Encounter (Signed)
Patient was supposed to see Dr Fletcher Anon today but appointment had to be rescheduled due to physician convenience. Patient saw Christell Faith on 02/09/18. Routing to [place] for advice as to whether patient can go ahead and have Eliquis discontinued before next appointment on 06/15/18 with Dr Fletcher Anon.

## 2018-05-15 NOTE — Telephone Encounter (Signed)
Pt asks if he is to come off of Eliquis. STates he was only given 1 weeks worth and will run out this Friday

## 2018-05-15 NOTE — Telephone Encounter (Signed)
Spoke with patients wife per release form and advised that he should continue Eliquis 5 mg twice daily and prescription sent into his pharmacy on file. She verbalized understanding with no further questions at this time.

## 2018-05-21 ENCOUNTER — Telehealth (INDEPENDENT_AMBULATORY_CARE_PROVIDER_SITE_OTHER): Payer: Self-pay | Admitting: Vascular Surgery

## 2018-05-21 NOTE — Telephone Encounter (Signed)
Patient will be going to [facility] to get prescription filled

## 2018-06-05 ENCOUNTER — Ambulatory Visit (INDEPENDENT_AMBULATORY_CARE_PROVIDER_SITE_OTHER): Payer: Medicare HMO | Admitting: Vascular Surgery

## 2018-06-05 ENCOUNTER — Other Ambulatory Visit (INDEPENDENT_AMBULATORY_CARE_PROVIDER_SITE_OTHER): Payer: Self-pay

## 2018-06-05 VITALS — BP 116/78 | HR 71 | Resp 16 | Ht 75.0 in | Wt 254.0 lb

## 2018-06-05 DIAGNOSIS — I83813 Varicose veins of bilateral lower extremities with pain: Secondary | ICD-10-CM

## 2018-06-05 DIAGNOSIS — I83811 Varicose veins of right lower extremities with pain: Secondary | ICD-10-CM | POA: Diagnosis not present

## 2018-06-05 NOTE — Progress Notes (Signed)
Varicose veins of both lower extremities with pain     The patient's right lower extremity was sterilely prepped and draped. The ultrasound machine was used to visualize the saphenous vein throughout its course. A segment in the upper calf was selected for access. The saphenous vein was accessed without difficulty using ultrasound guidance with a micro puncture needle. A micro puncture wire and sheath were then placed. A 0.018 wire was placed beyond the saphenofemoral junction through the sheath and the micro puncture sheath was removed. The 65 cm sheath was then placed over the wire and the wire and dilator were removed. The laser fiber was placed through the sheath and its tip was placed approximately 4-5 cm below the saphenofemoral junction. Tumescent anesthesia was then created with a dilute lidocaine solution. Laser energy was then delivered with constant withdrawal of the sheath and laser fiber. Approximately 1513 Joules of energy were delivered over a length of 40 cm using the 1470 Hz VenaCure machine at [employer]. Sterile dressings were placed. The patient tolerated the procedure well without complications.

## 2018-06-07 ENCOUNTER — Encounter (INDEPENDENT_AMBULATORY_CARE_PROVIDER_SITE_OTHER): Payer: PPO

## 2018-06-15 ENCOUNTER — Ambulatory Visit (INDEPENDENT_AMBULATORY_CARE_PROVIDER_SITE_OTHER): Payer: Medicare HMO

## 2018-06-15 ENCOUNTER — Ambulatory Visit: Payer: Medicare HMO | Admitting: Cardiovascular Disease

## 2018-06-15 DIAGNOSIS — I83811 Varicose veins of right lower extremities with pain: Secondary | ICD-10-CM | POA: Diagnosis not present

## 2018-06-15 DIAGNOSIS — I83813 Varicose veins of bilateral lower extremities with pain: Secondary | ICD-10-CM

## 2018-06-19 ENCOUNTER — Telehealth: Payer: Self-pay | Admitting: Cardiovascular Disease

## 2018-06-19 NOTE — Telephone Encounter (Signed)
S/w patient. Patient is not having any problems at this time. He simply needs f/u and is concerned about it being so far out. Patient scheduled to see Christell Faith on 07/31/18. Patient agreeable to appointment. He remembers there was discussion about him being able to come off his Eliquis and wanted to make sure it was ok to see Thurmond Butts. Advised patient to keep appointment as scheduled and I will make Ryan aware he is coming.

## 2018-06-19 NOTE — Telephone Encounter (Signed)
Please have him keep his appointment for follow-up.  We can have his primary cardiologist review if he would like for him to remain on Eliquis.  However, given his CHADS2VASc of 2 (HTN, age x1) he certainly may need to remain on this long-term.

## 2018-06-19 NOTE — Telephone Encounter (Signed)
Patient calling Patient saw R. Dunn on 02/09/2018 Needed a 3 month follow up to discuss possible Eliquis changes with Dr. Fletcher Anon but due to personal and provider emergencies, follow up appointments have been canceled Patient calling to schedule but declined next available on 9/13 due to needing a sooner appointment Please call to discuss

## 2018-06-22 DIAGNOSIS — I10 Essential (primary) hypertension: Secondary | ICD-10-CM | POA: Diagnosis not present

## 2018-06-22 DIAGNOSIS — R600 Localized edema: Secondary | ICD-10-CM | POA: Diagnosis not present

## 2018-06-22 DIAGNOSIS — I4891 Unspecified atrial fibrillation: Secondary | ICD-10-CM | POA: Diagnosis not present

## 2018-07-06 ENCOUNTER — Ambulatory Visit (INDEPENDENT_AMBULATORY_CARE_PROVIDER_SITE_OTHER): Payer: Medicare HMO | Admitting: Vascular Surgery

## 2018-07-06 ENCOUNTER — Encounter (INDEPENDENT_AMBULATORY_CARE_PROVIDER_SITE_OTHER): Payer: Self-pay | Admitting: Vascular Surgery

## 2018-07-06 VITALS — BP 123/78 | HR 53 | Resp 13 | Ht 75.0 in | Wt 258.0 lb

## 2018-07-06 DIAGNOSIS — I83813 Varicose veins of bilateral lower extremities with pain: Secondary | ICD-10-CM | POA: Diagnosis not present

## 2018-07-06 DIAGNOSIS — I10 Essential (primary) hypertension: Secondary | ICD-10-CM

## 2018-07-06 DIAGNOSIS — R600 Localized edema: Secondary | ICD-10-CM | POA: Diagnosis not present

## 2018-07-06 NOTE — Progress Notes (Signed)
MRN : [id number]  Luis Patton is a 67 y.o. (1951-06-15) male who presents with chief complaint of  Chief Complaint  Patient presents with  . Follow-up    3-4 week post laser  .  History of Present Illness: Patient returns today in follow up of venous disease.  He is about a month status post right leg laser ablation which was successful without DVT.  He still has some burning and stinging in that right leg as well as cramping night, but overall the leg is doing some better.  The veins are a lot less prominent.  He has reflux in the left leg but says it current his left leg is not really bothering him that much.  Current Outpatient Medications  Medication Sig Dispense Refill  . aspirin EC 81 MG tablet Take 81 mg by mouth daily.    . benazepril (LOTENSIN) 20 MG tablet Take 20 mg by mouth daily.    . Flaxseed, Linseed, (FLAXSEED OIL) 1000 MG CAPS Take 1,000 mg daily by mouth.    . Garlic 175 MG CAPS Take 500 mg daily by mouth.    . hydrochlorothiazide (MICROZIDE) 12.5 MG capsule     . metoprolol tartrate (LOPRESSOR) 25 MG tablet Take 1 tablet (25 mg total) by mouth 2 (two) times daily. 180 tablet 3  . Multiple Vitamin (MULTIVITAMIN) tablet Take 1 tablet by mouth daily.    . Omega-3 Fatty Acids (FISH OIL) 1000 MG CAPS Take 1,000 mg daily by mouth.    Marland Kitchen omeprazole (PRILOSEC) 20 MG capsule Take 20 mg daily as needed by mouth.    Marland Kitchen OVER THE COUNTER MEDICATION Take 2 capsules daily by mouth. Veggie Basics    . PUMPKIN SEED PO Take 1,000 mg daily by mouth.    . triamcinolone (NASACORT) 55 MCG/ACT AERO nasal inhaler Place 1 spray daily as needed into the nose (congestion).     . Turmeric 400 MG CAPS Take 800 mg daily by mouth.    Marland Kitchen apixaban (ELIQUIS) 5 MG TABS tablet Take 1 tablet (5 mg total) by mouth 2 (two) times daily. (Patient not taking: Reported on 07/06/2018) 60 tablet 6   No current facility-administered medications for this visit.     Past Medical History:  Diagnosis Date    . A-fib ([place])   . Bladder calculi    a. 10/2017 s/p cystolitholapaxy  . BPH (benign prostatic hyperplasia)    a. 10/2017 s/p TURP.  Marland Kitchen Chronic venous stasis   . DDD (degenerative disc disease), lumbar    L4-5  . History of echocardiogram    a. TTE 1/19: EF 55-60%, no RWMA, not technically sufficient to allow for LV diastolic function due to Afib, mildly dilated left atrium measuring 45 mm  . History of stress test    a. Reportedly nl.  . Hyperlipidemia   . Hypertension   . Palpitations    a. Has previously worn monitor w/o findings ([place]).  . Persistent atrial fibrillation ([place])    a. diagnosed 01/08/18; b. CHADS2VASc => 2 (HTN, age x 1); c. On eliquis  . Polycystic liver disease   . Varicose veins of lower extremity     Past Surgical History:  Procedure Laterality Date  . COLON SURGERY    . COLONOSCOPY WITH PROPOFOL N/A 01/08/2018   Procedure: COLONOSCOPY WITH PROPOFOL;  Surgeon: Jonathon Bellows, MD;  Location[facility] ENDOSCOPY;  Service: Gastroenterology;  Laterality: N/A;  . CYSTOSCOPY WITH LITHOLAPAXY N/A 10/31/2017   Procedure: CYSTOSCOPY WITH  LITHOLAPAXY;  Surgeon: Abbie Sons, MD;  Location: [facility] ORS;  Service: Urology;  Laterality: N/A;  . FINGER FRACTURE SURGERY    . HERNIA REPAIR    . LASER ABLATION     2019  . PROSTATE SURGERY    . TONSILLECTOMY    . TRANSURETHRAL RESECTION OF PROSTATE N/A 10/31/2017   Procedure: TRANSURETHRAL RESECTION OF THE PROSTATE (TURP);  Surgeon: Abbie Sons, MD;  Location: [facility] ORS;  Service: Urology;  Laterality: N/A;    Social History Social History   Tobacco Use  . Smoking status: Former Smoker    Last attempt to quit: 10/27/1989    Years since quitting: 28.7  . Smokeless tobacco: Current User    Types: Chew  Substance Use Topics  . Alcohol use: No  . Drug use: No     Family History Family History  Problem Relation Age of Onset  . Stroke Mother        died @ 24  . Congestive Heart Failure Father        died @ 77   . Prostate cancer Neg Hx   . Bladder Cancer Neg Hx   . Kidney cancer Neg Hx      No Known Allergies   REVIEW OF SYSTEMS (Negative unless checked)  Constitutional: [] Weight loss  [] Fever  [] Chills Cardiac: [] Chest pain   [] Chest pressure   [x] Palpitations   [] Shortness of breath when laying flat   [] Shortness of breath at rest   [] Shortness of breath with exertion. Vascular:  [] Pain in legs with walking   [] Pain in legs at rest   [] Pain in legs when laying flat   [] Claudication   [] Pain in feet when walking  [] Pain in feet at rest  [] Pain in feet when laying flat   [] History of DVT   [] Phlebitis   [x] Swelling in legs   [x] Varicose veins   [] Non-healing ulcers Pulmonary:   [] Uses home oxygen   [] Productive cough   [] Hemoptysis   [] Wheeze  [] COPD   [] Asthma Neurologic:  [] Dizziness  [] Blackouts   [] Seizures   [] History of stroke   [] History of TIA  [] Aphasia   [] Temporary blindness   [] Dysphagia   [] Weakness or numbness in arms   [] Weakness or numbness in legs Musculoskeletal:  [x] Arthritis   [] Joint swelling   [] Joint pain   [] Low back pain Hematologic:  [] Easy bruising  [] Easy bleeding   [] Hypercoagulable state   [] Anemic  [] Hepatitis Gastrointestinal:  [] Blood in stool   [] Vomiting blood  [] Gastroesophageal reflux/heartburn   [] Abdominal pain Genitourinary:  [] Chronic kidney disease   [] Difficult urination  [] Frequent urination  [] Burning with urination   [] Hematuria Skin:  [] Rashes   [] Ulcers   [] Wounds Psychological:  [] History of anxiety   []  History of major depression.    Physical Examination  BP 123/78 (BP Location: Right Arm, Patient Position: Sitting)   Pulse (!) 53   Resp 13   Ht 6\' 3"  (1.905 m)   Wt 258 lb (117 kg)   BMI 32.25 kg/m  Gen:  WD/WN, NAD Head: [place]/AT, No temporalis wasting. Ear/Nose/Throat: Hearing grossly intact, nares w/o erythema or drainage Eyes: Conjunctiva clear. Sclera non-icteric Neck: Supple.  Trachea midline Pulmonary:  Good air movement, no  use of accessory muscles.  Cardiac: Irregular Vascular: Stasis dermatitis changes present in the right lower leg.  None really present in the left leg on exam today.  Diffuse varicosities on the right measuring about 2 mm and diffuse Varicosities are on the  left measuring 1 to 2 mm. Vessel Right Left  Radial Palpable Palpable                          PT Palpable Palpable  DP Palpable Palpable   Gastrointestinal: soft, non-tender/non-distended. No guarding/reflex.  Musculoskeletal: M/S 5/5 throughout.  No deformity or atrophy.  Trace right lower extremity edema. Neurologic: Sensation grossly intact in extremities.  Symmetrical.  Speech is fluent.  Psychiatric: Judgment intact, Mood & affect appropriate for pt's clinical situation. Dermatologic: No rashes or ulcers noted.  No cellulitis or open wounds.       Labs No results found for this or any previous visit (from the past 2160 hour(s)).  Radiology No results found.  Assessment/Plan  Essential hypertension blood pressure control important in reducing the progression of atherosclerotic disease. On appropriate oral medications.   Bilateral lower extremity edema Likely from venous insufficiency   Varicose veins of both lower extremities with pain The patient is doing well after right leg laser ablation.  At current, his left leg is not really bothering him enough to do any intervention which is reasonable.  I will see him back in 3 to 4 months to reassess his left leg as well as to see if he would like to have any additional treatment for the residual varicosities on the right leg.    Leotis Pain, MD  07/06/2018 9:37 AM    This note was created with Dragon medical transcription system.  Any errors from dictation are purely unintentional

## 2018-07-06 NOTE — Assessment & Plan Note (Signed)
The patient is doing well after right leg laser ablation.  At current, his left leg is not really bothering him enough to do any intervention which is reasonable.  I will see him back in 3 to 4 months to reassess his left leg as well as to see if he would like to have any additional treatment for the residual varicosities on the right leg.

## 2018-07-31 ENCOUNTER — Encounter

## 2018-07-31 ENCOUNTER — Ambulatory Visit: Payer: Medicare HMO | Admitting: Physician Assistant

## 2018-10-04 IMAGING — CR DG SHOULDER 2+V*L*
3 series · 3 of 3 positions shown · non-contrast
Comparison: None.

CLINICAL DATA: Left-sided neck and shoulder pain beginning 3 days
ago.

EXAM:
LEFT SHOULDER - 2+ VIEW

[shoulder grashey]
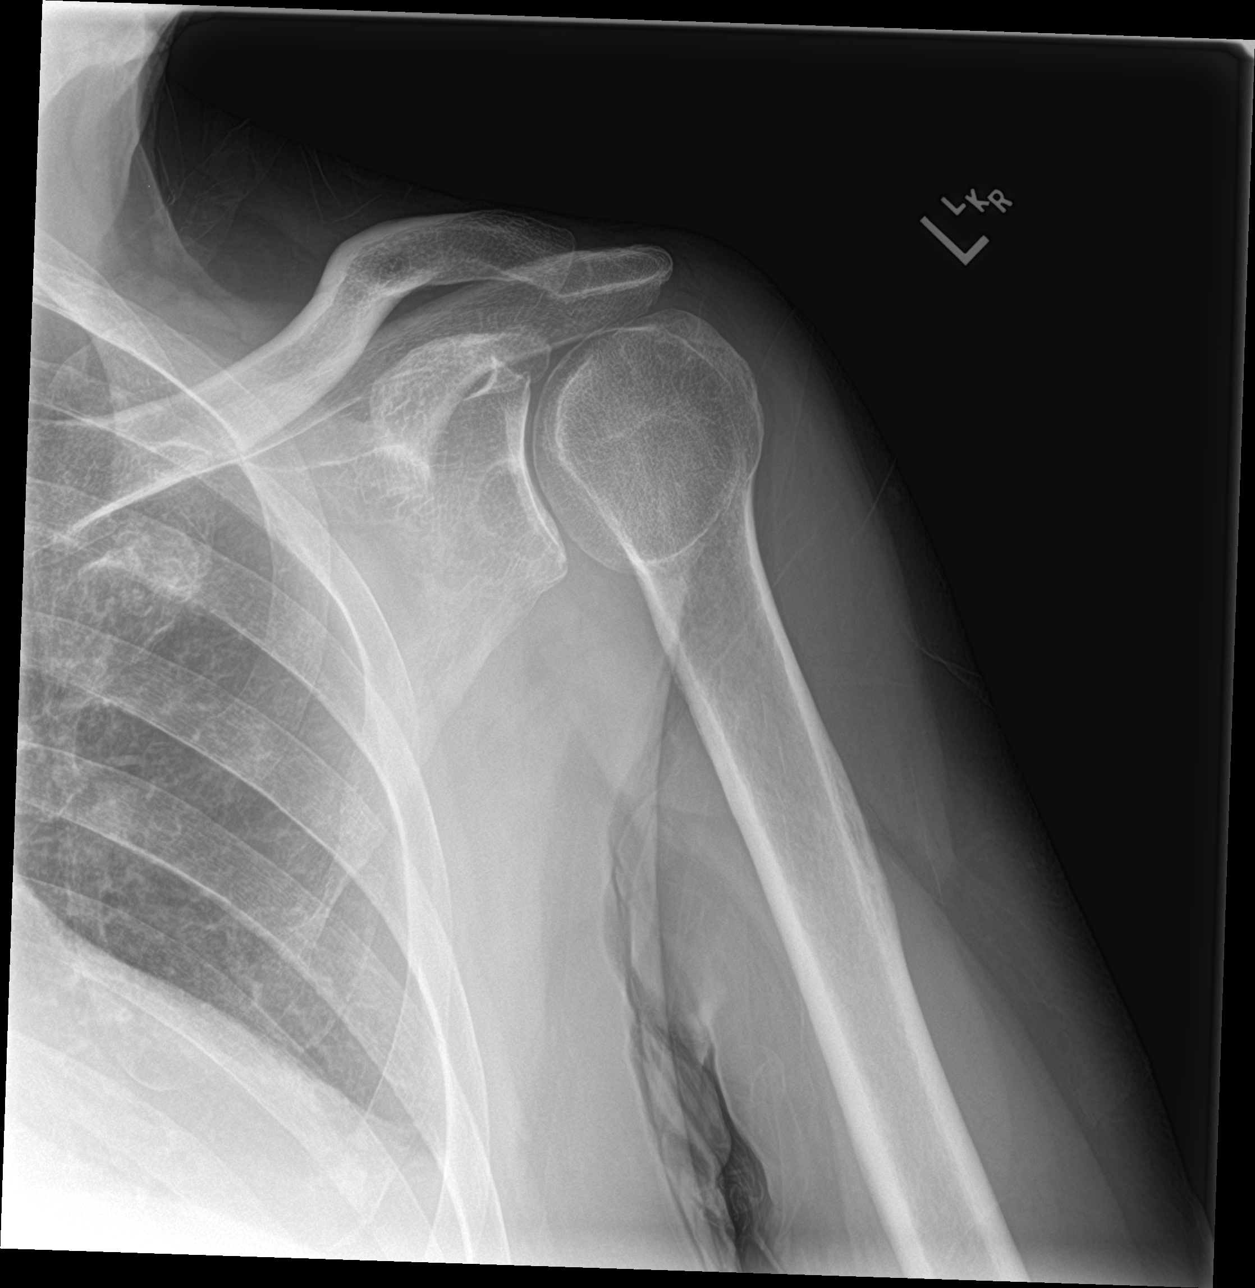

[shoulder y view]
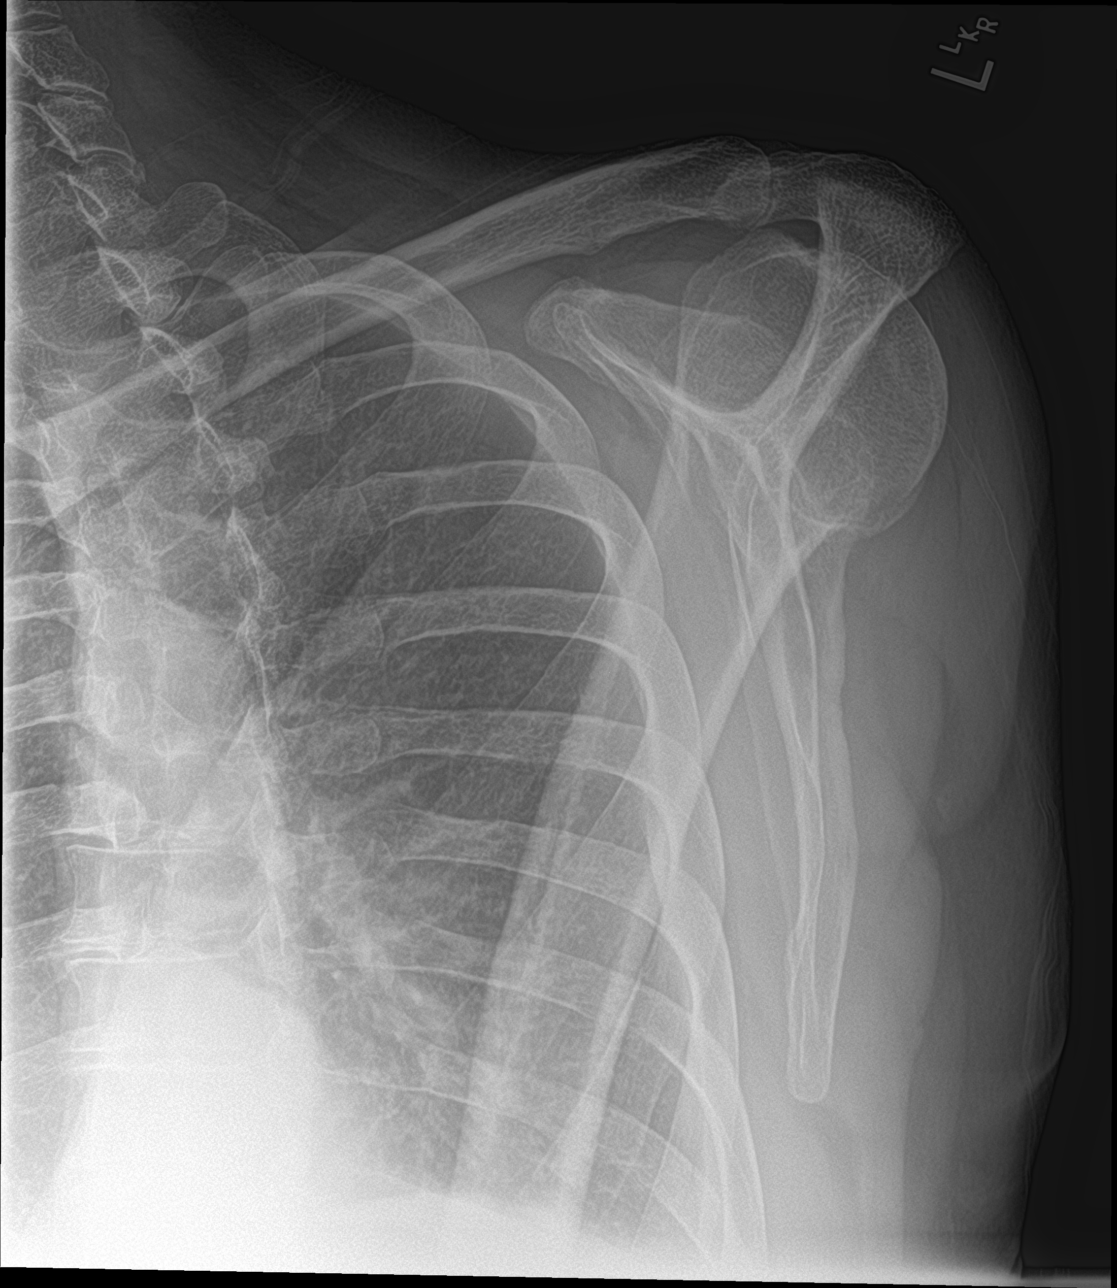

[shoulder axillary]
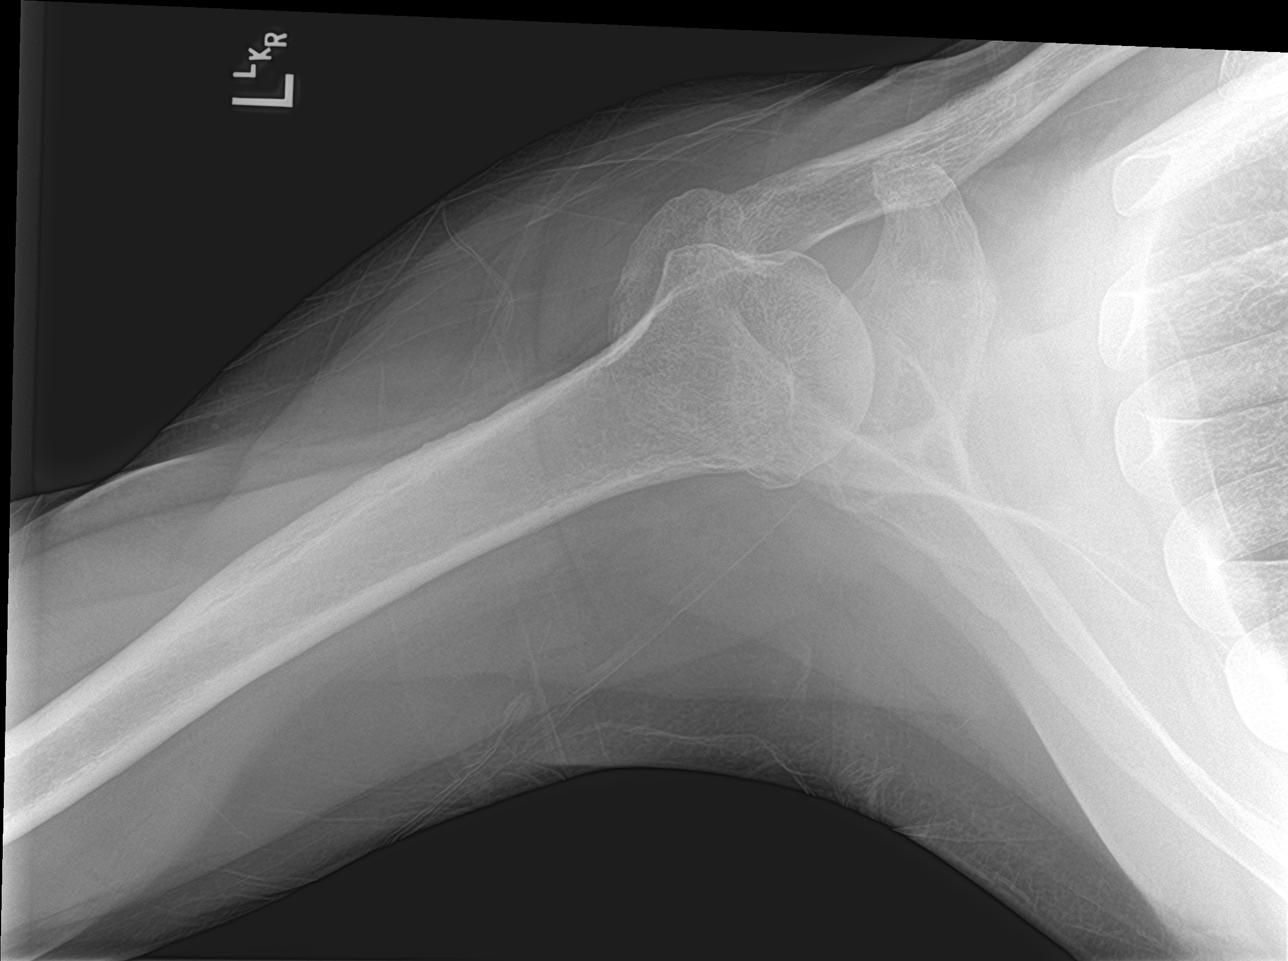

[3 of 3 positions shown; findings below may reference images not displayed]

FINDINGS: DN humeral head is normally located relative to the glenoid. There
is a large subchondral cysts of the glenoid. Humeral acromial
distance is mildly narrowed. AC joint appears normal.
IMPRESSION: Subchondral cyst to the glenoid suggesting degenerative disease at
the glenohumeral joint. Narrowing of the humeral acromial distance
which could be associated with rotator cuff disease.

## 2018-11-02 ENCOUNTER — Ambulatory Visit (INDEPENDENT_AMBULATORY_CARE_PROVIDER_SITE_OTHER): Payer: Medicare HMO | Admitting: Vascular Surgery
